# Patient Record
Sex: Female | Born: 1960 | Race: White | Hispanic: No | Smoking: Never smoker
Health system: Southern US, Community
[De-identification: ages and names within clinical notes are randomized; demographics above are authoritative.]

## PROBLEM LIST (undated history)

## (undated) DIAGNOSIS — S52133A Displaced fracture of neck of unspecified radius, initial encounter for closed fracture: Secondary | ICD-10-CM

## (undated) DIAGNOSIS — E559 Vitamin D deficiency, unspecified: Secondary | ICD-10-CM

## (undated) DIAGNOSIS — C801 Malignant (primary) neoplasm, unspecified: Secondary | ICD-10-CM

## (undated) DIAGNOSIS — S52023A Displaced fracture of olecranon process without intraarticular extension of unspecified ulna, initial encounter for closed fracture: Secondary | ICD-10-CM

## (undated) DIAGNOSIS — J189 Pneumonia, unspecified organism: Secondary | ICD-10-CM

## (undated) DIAGNOSIS — R112 Nausea with vomiting, unspecified: Secondary | ICD-10-CM

## (undated) DIAGNOSIS — Z9889 Other specified postprocedural states: Secondary | ICD-10-CM

## (undated) DIAGNOSIS — F419 Anxiety disorder, unspecified: Secondary | ICD-10-CM

## (undated) DIAGNOSIS — R519 Headache, unspecified: Secondary | ICD-10-CM

## (undated) DIAGNOSIS — R51 Headache: Secondary | ICD-10-CM

## (undated) DIAGNOSIS — T7840XA Allergy, unspecified, initial encounter: Secondary | ICD-10-CM

## (undated) HISTORY — DX: Allergy, unspecified, initial encounter: T78.40XA

## (undated) HISTORY — PX: ABDOMINAL HYSTERECTOMY: SHX81

## (undated) HISTORY — DX: Anxiety disorder, unspecified: F41.9

## (undated) HISTORY — PX: CHOLECYSTECTOMY: SHX55

## (undated) HISTORY — DX: Vitamin D deficiency, unspecified: E55.9

---

## 2001-04-23 ENCOUNTER — Emergency Department (HOSPITAL_COMMUNITY): Admission: EM | Admit: 2001-04-23 | Discharge: 2001-04-23 | Payer: Self-pay | Admitting: *Deleted

## 2001-04-23 ENCOUNTER — Encounter: Payer: Self-pay | Admitting: *Deleted

## 2002-08-11 ENCOUNTER — Ambulatory Visit (HOSPITAL_COMMUNITY): Admission: RE | Admit: 2002-08-11 | Discharge: 2002-08-11 | Payer: Self-pay | Admitting: Preventative Medicine

## 2002-08-11 ENCOUNTER — Encounter: Payer: Self-pay | Admitting: Preventative Medicine

## 2003-02-14 ENCOUNTER — Ambulatory Visit (HOSPITAL_COMMUNITY): Admission: RE | Admit: 2003-02-14 | Discharge: 2003-02-14 | Payer: Self-pay

## 2004-07-26 ENCOUNTER — Ambulatory Visit (HOSPITAL_COMMUNITY): Admission: RE | Admit: 2004-07-26 | Discharge: 2004-07-26 | Payer: Self-pay | Admitting: Obstetrics and Gynecology

## 2006-09-16 ENCOUNTER — Ambulatory Visit: Payer: Self-pay | Admitting: Hematology & Oncology

## 2007-12-01 ENCOUNTER — Other Ambulatory Visit: Admission: RE | Admit: 2007-12-01 | Discharge: 2007-12-01 | Payer: Self-pay | Admitting: Obstetrics and Gynecology

## 2008-08-26 ENCOUNTER — Emergency Department (HOSPITAL_COMMUNITY): Admission: EM | Admit: 2008-08-26 | Discharge: 2008-08-26 | Payer: Self-pay | Admitting: Emergency Medicine

## 2009-11-27 ENCOUNTER — Other Ambulatory Visit
Admission: RE | Admit: 2009-11-27 | Discharge: 2009-11-27 | Payer: Self-pay | Source: Home / Self Care | Admitting: Obstetrics and Gynecology

## 2010-11-29 ENCOUNTER — Other Ambulatory Visit (HOSPITAL_COMMUNITY)
Admission: RE | Admit: 2010-11-29 | Discharge: 2010-11-29 | Disposition: A | Discharge status: Home / Self Care | Payer: BC Managed Care – PPO | Source: Ambulatory Visit | Attending: Obstetrics and Gynecology | Admitting: Obstetrics and Gynecology

## 2010-11-29 DIAGNOSIS — Z01419 Encounter for gynecological examination (general) (routine) without abnormal findings: Secondary | ICD-10-CM | POA: Insufficient documentation

## 2011-12-23 ENCOUNTER — Other Ambulatory Visit: Payer: Self-pay | Admitting: Obstetrics and Gynecology

## 2011-12-23 DIAGNOSIS — Z09 Encounter for follow-up examination after completed treatment for conditions other than malignant neoplasm: Secondary | ICD-10-CM

## 2011-12-23 DIAGNOSIS — Z139 Encounter for screening, unspecified: Secondary | ICD-10-CM

## 2012-01-08 ENCOUNTER — Encounter (HOSPITAL_COMMUNITY): Payer: BC Managed Care – PPO

## 2012-01-15 ENCOUNTER — Encounter (HOSPITAL_COMMUNITY): Payer: BC Managed Care – PPO

## 2012-01-16 ENCOUNTER — Other Ambulatory Visit (HOSPITAL_COMMUNITY)
Admission: RE | Admit: 2012-01-16 | Discharge: 2012-01-16 | Disposition: A | Discharge status: Home / Self Care | Payer: BC Managed Care – PPO | Source: Ambulatory Visit | Attending: Obstetrics and Gynecology | Admitting: Obstetrics and Gynecology

## 2012-01-16 ENCOUNTER — Other Ambulatory Visit: Payer: Self-pay | Admitting: Adult Health

## 2012-01-16 ENCOUNTER — Ambulatory Visit (HOSPITAL_COMMUNITY)
Admission: RE | Admit: 2012-01-16 | Discharge: 2012-01-16 | Disposition: A | Discharge status: Home / Self Care | Payer: BC Managed Care – PPO | Source: Ambulatory Visit | Attending: Obstetrics and Gynecology | Admitting: Obstetrics and Gynecology

## 2012-01-16 DIAGNOSIS — Z1151 Encounter for screening for human papillomavirus (HPV): Secondary | ICD-10-CM | POA: Insufficient documentation

## 2012-01-16 DIAGNOSIS — Z09 Encounter for follow-up examination after completed treatment for conditions other than malignant neoplasm: Secondary | ICD-10-CM

## 2012-01-16 DIAGNOSIS — Z01419 Encounter for gynecological examination (general) (routine) without abnormal findings: Secondary | ICD-10-CM | POA: Insufficient documentation

## 2012-01-16 DIAGNOSIS — R928 Other abnormal and inconclusive findings on diagnostic imaging of breast: Secondary | ICD-10-CM | POA: Insufficient documentation

## 2012-08-15 ENCOUNTER — Other Ambulatory Visit: Payer: Self-pay | Admitting: Adult Health

## 2013-01-19 ENCOUNTER — Other Ambulatory Visit: Payer: Self-pay | Admitting: Adult Health

## 2014-02-24 ENCOUNTER — Other Ambulatory Visit: Payer: Self-pay | Admitting: Adult Health

## 2014-04-18 ENCOUNTER — Other Ambulatory Visit: Payer: Self-pay | Admitting: Adult Health

## 2014-04-28 ENCOUNTER — Ambulatory Visit (INDEPENDENT_AMBULATORY_CARE_PROVIDER_SITE_OTHER): Payer: BLUE CROSS/BLUE SHIELD | Admitting: Adult Health

## 2014-04-28 ENCOUNTER — Encounter: Payer: Self-pay | Admitting: Adult Health

## 2014-04-28 VITALS — BP 108/72 | HR 92 | Ht 67.0 in | Wt 192.0 lb

## 2014-04-28 DIAGNOSIS — Z1212 Encounter for screening for malignant neoplasm of rectum: Secondary | ICD-10-CM | POA: Diagnosis not present

## 2014-04-28 DIAGNOSIS — Z01419 Encounter for gynecological examination (general) (routine) without abnormal findings: Secondary | ICD-10-CM

## 2014-04-28 DIAGNOSIS — F419 Anxiety disorder, unspecified: Secondary | ICD-10-CM | POA: Insufficient documentation

## 2014-04-28 LAB — HEMOCCULT GUIAC POC 1CARD (OFFICE): Fecal Occult Blood, POC: NEGATIVE

## 2014-04-28 MED ORDER — ESCITALOPRAM OXALATE 20 MG PO TABS: 20.0000 mg | ORAL_TABLET | Freq: Every day | ORAL | Status: DC

## 2014-04-28 NOTE — Progress Notes (Signed)
Patient ID: Rhonda [name], female   DOB: 14-Mar-1960, 54 y.o.   MRN: [id number] History of Present Illness: Rhonda [name] is a 54 year old white female, divorced in for well woman gyn exam.She had a normal pap with negative HPV 01/16/12.She still sells insurance.   Current Medications, Allergies, Past Medical History, Past Surgical History, Family History and Social History were reviewed in [employer] record.     Review of Systems: Patient denies any headaches, hearing loss, fatigue, blurred vision, shortness of breath, chest pain, abdominal pain, problems with bowel movements(occasional constipation), urination, or intercourse(not having sex). No joint pain or mood swings.Has some allergies and is taking lexapro for anxiety and it works.    Physical Exam:BP 108/72 mmHg  Pulse 92  Ht 5\' 7"  (1.702 m)  Wt 192 lb (87.091 kg)  BMI 30.06 kg/m2 General:  Well developed, well nourished, no acute distress Skin:  Warm and dry Neck:  Midline trachea, normal thyroid, good ROM, no lymphadenopathy Lungs; Clear to auscultation bilaterally Breast:  No dominant palpable mass, retraction, or nipple discharge Cardiovascular: Regular rate and rhythm Abdomen:  Soft, non tender, no hepatosplenomegaly Pelvic:  External genitalia is normal in appearance, no lesions.  The vagina is normal in appearance. Urethra has no lesions or masses. The cervix and uterus are absent.  No adnexal masses or tenderness noted.Bladder is non tender, no masses felt. Rectal: Good sphincter tone, no polyps, or hemorrhoids felt.  Hemoccult negative. Extremities/musculoskeletal:  No swelling or varicosities noted, no clubbing or cyanosis Psych:  No mood changes, alert and cooperative,seems happy   Impression: Well woman gyn exam no pap Anxiety     Plan: Check CBC,CMP,TSH and lipids Physical in 1 year Mammogram now and yearly Colonoscopy advised Refilled lexapro 20 mg #30 1 daily with 11 refills

## 2014-04-28 NOTE — Patient Instructions (Signed)
Physical in 1 year Mammogram now and yearly Colonoscopy advised

## 2014-04-29 ENCOUNTER — Telehealth: Payer: Self-pay | Admitting: Adult Health

## 2014-04-29 LAB — COMPREHENSIVE METABOLIC PANEL
A/G RATIO: 1.8 (ref 1.1–2.5)
ALBUMIN: 4.8 g/dL (ref 3.5–5.5)
ALK PHOS: 111 IU/L (ref 39–117)
ALT: 17 IU/L (ref 0–32)
AST: 17 IU/L (ref 0–40)
BILIRUBIN TOTAL: 0.3 mg/dL (ref 0.0–1.2)
BUN / CREAT RATIO: 11 (ref 9–23)
BUN: 9 mg/dL (ref 6–24)
CO2: 23 mmol/L (ref 18–29)
Calcium: 9.5 mg/dL (ref 8.7–10.2)
Chloride: 99 mmol/L (ref 97–108)
Creatinine, Ser: 0.8 mg/dL (ref 0.57–1.00)
GFR, EST AFRICAN AMERICAN: 97 mL/min/{1.73_m2} (ref >59)
GFR, EST NON AFRICAN AMERICAN: 84 mL/min/{1.73_m2} (ref >59)
GLOBULIN, TOTAL: 2.6 g/dL (ref 1.5–4.5)
GLUCOSE: 94 mg/dL (ref 65–99)
Potassium: 4.3 mmol/L (ref 3.5–5.2)
Sodium: 140 mmol/L (ref 134–144)
TOTAL PROTEIN: 7.4 g/dL (ref 6.0–8.5)

## 2014-04-29 LAB — CBC
HEMATOCRIT: 39.6 % (ref 34.0–46.6)
HEMOGLOBIN: 13.9 g/dL (ref 11.1–15.9)
MCH: 29.3 pg (ref 26.6–33.0)
MCHC: 35.1 g/dL (ref 31.5–35.7)
MCV: 84 fL (ref 79–97)
Platelets: 355 10*3/uL (ref 150–379)
RBC: 4.74 x10E6/uL (ref 3.77–5.28)
RDW: 13.3 % (ref 12.3–15.4)
WBC: 7 10*3/uL (ref 3.4–10.8)

## 2014-04-29 LAB — LIPID PANEL
CHOL/HDL RATIO: 3.4 ratio (ref 0.0–4.4)
CHOLESTEROL TOTAL: 258 mg/dL — AB (ref 100–199)
HDL: 77 mg/dL (ref >39)
LDL Calculated: 160 mg/dL — ABNORMAL HIGH (ref 0–99)
Triglycerides: 103 mg/dL (ref 0–149)
VLDL Cholesterol Cal: 21 mg/dL (ref 5–40)

## 2014-04-29 LAB — TSH: TSH: 2.55 u[IU]/mL (ref 0.450–4.500)

## 2014-04-29 NOTE — Telephone Encounter (Signed)
Left message labs looked good

## 2014-11-12 ENCOUNTER — Emergency Department (HOSPITAL_COMMUNITY): Payer: BLUE CROSS/BLUE SHIELD

## 2014-11-12 ENCOUNTER — Encounter (HOSPITAL_COMMUNITY): Payer: Self-pay | Admitting: *Deleted

## 2014-11-12 ENCOUNTER — Emergency Department (HOSPITAL_COMMUNITY)
Admission: EM | Admit: 2014-11-12 | Discharge: 2014-11-13 | Disposition: A | Discharge status: Home / Self Care | Payer: BLUE CROSS/BLUE SHIELD | Attending: Emergency Medicine | Admitting: Emergency Medicine

## 2014-11-12 DIAGNOSIS — Y9389 Activity, other specified: Secondary | ICD-10-CM | POA: Diagnosis not present

## 2014-11-12 DIAGNOSIS — Z79899 Other long term (current) drug therapy: Secondary | ICD-10-CM | POA: Insufficient documentation

## 2014-11-12 DIAGNOSIS — S59902A Unspecified injury of left elbow, initial encounter: Secondary | ICD-10-CM | POA: Diagnosis present

## 2014-11-12 DIAGNOSIS — S52182A Other fracture of upper end of left radius, initial encounter for closed fracture: Secondary | ICD-10-CM | POA: Diagnosis not present

## 2014-11-12 DIAGNOSIS — S42402A Unspecified fracture of lower end of left humerus, initial encounter for closed fracture: Secondary | ICD-10-CM

## 2014-11-12 DIAGNOSIS — W010XXA Fall on same level from slipping, tripping and stumbling without subsequent striking against object, initial encounter: Secondary | ICD-10-CM | POA: Insufficient documentation

## 2014-11-12 DIAGNOSIS — Y998 Other external cause status: Secondary | ICD-10-CM | POA: Insufficient documentation

## 2014-11-12 DIAGNOSIS — Y9289 Other specified places as the place of occurrence of the external cause: Secondary | ICD-10-CM | POA: Insufficient documentation

## 2014-11-12 DIAGNOSIS — S52092A Other fracture of upper end of left ulna, initial encounter for closed fracture: Secondary | ICD-10-CM | POA: Insufficient documentation

## 2014-11-12 DIAGNOSIS — F419 Anxiety disorder, unspecified: Secondary | ICD-10-CM | POA: Diagnosis not present

## 2014-11-12 MED ORDER — FENTANYL CITRATE (PF) 100 MCG/2ML IJ SOLN
100.0000 ug | Freq: Once | INTRAMUSCULAR | Status: AC
  Administered 2014-11-12: 100 ug via INTRAVENOUS

## 2014-11-12 MED ORDER — FENTANYL CITRATE (PF) 100 MCG/2ML IJ SOLN
INTRAMUSCULAR | Status: AC
  Administered 2014-11-12: 100 ug via INTRAVENOUS
  Filled 2014-11-12: qty 2

## 2014-11-12 NOTE — ED Provider Notes (Signed)
CSN: [id number]     Arrival date & time 11/12/14  2122 History   First MD Initiated Contact with Patient 11/12/14 2150     Chief Complaint  Patient presents with  . Elbow Injury     (Consider location/radiation/quality/duration/timing/severity/associated sxs/prior Treatment) HPI   Rhonda [name] is a 54 y.o. female who presents to the Emergency Department complaining of sudden onset of left elbow pain after a mechanical fall that occurred at 8:00 pm this evening.  She states that she tripped over a toy and fell onto her left elbow.  Describes a severe pain with movement of the left elbow and "feels like bones crunching when I try to move it"  She denies other injuries.  She has applied ice packs, she has not taken any medications for symptom relief.  She denies numbness, neck pain, head injury , LOC and wrist pain.  She also denies anti-coagulants.   Past Medical History  Diagnosis Date  . Anxiety   . Allergy     seasonal   Past Surgical History  Procedure Laterality Date  . Cholecystectomy     Family History  Problem Relation Age of Onset  . Other Mother     sepsis  . Heart attack Father   . Hypertension Sister   . Hypertension Brother   . Epilepsy Daughter   . ADD / ADHD Daughter   . Hypertension Brother    Social History  Substance Use Topics  . Smoking status: Never Smoker   . Smokeless tobacco: Never Used  . Alcohol Use: Yes     Comment: wine occ   OB History    Gravida Para Term Preterm AB TAB SAB Ectopic Multiple Living   1 1             Review of Systems  Constitutional: Negative for fever and chills.  Respiratory: Negative for shortness of breath.   Cardiovascular: Negative for chest pain.  Gastrointestinal: Negative for nausea and vomiting.  Musculoskeletal: Positive for joint swelling and arthralgias. Negative for neck pain.  Skin: Negative for color change and wound.  Neurological: Negative for syncope, weakness and numbness.  All other systems  reviewed and are negative.     Allergies  Review of patient's allergies indicates no known allergies.  Home Medications   Prior to Admission medications   Medication Sig Start Date End Date Taking? Authorizing Provider  escitalopram (LEXAPRO) 20 MG tablet Take 1 tablet (20 mg total) by mouth daily. 04/28/14   Estill Dooms, NP  Loratadine-Pseudoephedrine (CLARITIN-D 24 HOUR PO) Take by mouth as needed.    Historical Provider, MD  Multiple Vitamin (MULTIVITAMIN) tablet Take 1 tablet by mouth daily.    Historical Provider, MD  Multiple Vitamins-Minerals (AIRBORNE PO) Take by mouth as needed.    Historical Provider, MD   BP 127/56 mmHg  Pulse 116  Temp(Src) 98 F (36.7 C) (Oral)  Resp 14  Ht 5\' 8"  (1.727 m)  Wt 188 lb (85.276 kg)  BMI 28.59 kg/m2  SpO2 99% Physical Exam  Constitutional: She is oriented to person, place, and time. She appears well-developed and well-nourished. No distress.  HENT:  Head: Normocephalic and atraumatic.  Cardiovascular: Normal rate, regular rhythm and normal heart sounds.   Pulmonary/Chest: Effort normal and breath sounds normal. No respiratory distress.  Musculoskeletal: She exhibits edema and tenderness.  Diffuse ttp of the left lateral elbow.  Mild edema.  Radial pulse is brisk, distal sensation intact.  CR< 2 sec.  Compartments  soft.  No significant tenderness proximal or distal to the elbow.  Neurological: She is alert and oriented to person, place, and time. She exhibits normal muscle tone. Coordination normal.  Skin: Skin is warm and dry.  Nursing note and vitals reviewed.   ED Course  Procedures (including critical care time)  Imaging Review Dg Elbow 2 Views Left  11/13/2014  CLINICAL DATA:  Status post reduction of left elbow dislocation EXAM: LEFT ELBOW - 2 VIEW COMPARISON:  None. FINDINGS: There is a comminuted, mildly displaced proximal left ulnar fracture. There is interval reduction of ulnohumeral dislocation. There is a  fracture of the left radial head which is dorsally dislocated. There is a small fracture fragment which is displaced anteriorly arising from the left radial head. There is a large joint effusion. IMPRESSION: 1. Comminuted, mildly displaced proximal left ulnar fracture. There is interval reduction of ulnohumeral dislocation. 2. Fracture of the left radial head which is dorsally dislocated. Electronically Signed   By: Kathreen Devoid   On: 11/13/2014 00:53   Dg Elbow Complete Left  11/12/2014  CLINICAL DATA:  Status post fall.  Ulnar pain. EXAM: LEFT ELBOW - COMPLETE 3+ VIEW COMPARISON:  None. FINDINGS: There is a comminuted fracture of the proximal left ulna with posterior dislocation. There is a nondisplaced fracture of the volar aspect of the left radial head with posterior dislocation. There is no other fracture. IMPRESSION: Comminuted fracture of the proximal left ulna with posterior dislocation. Nondisplaced fracture of the volar aspect of the left radial head with posterior dislocation. Electronically Signed   By: Kathreen Devoid   On: 11/12/2014 22:34   I have personally reviewed and evaluated these images results as part of my medical decision-making.    MDM   Final diagnoses:  Closed fracture dislocation of left elbow, initial encounter   Pt remains NV intact.  No open wounds.  XR results reviewed.  Ice applied.  Pt remains NPO, last meal at 7:00 pm.  Will consult hand surgeon on call.    2250  Consulted Dr. Burney Gauze , discussed the XR findings, he recommends reduction of the elbow and post splint, he agrees to see pt in his office on Monday afternoon, stating that reduction is indicated and surgical repair can be performed on outpatient basis.   I have discussed care plan with my attending physician and I was advised to contact general orthopedics.    Jordan Lorin Mercy who has reviewed the films and discussed specific treatment plan with Dr. Thurnell Garbe.    Kem Parkinson, PA-C 11/13/14  Goff, DO 11/16/14 2114

## 2014-11-12 NOTE — ED Notes (Signed)
Pt tripped over a toy and fell on her left elbow.

## 2014-11-12 NOTE — ED Notes (Signed)
With assistance of Dr. Thurnell Garbe, Cyndi Bender, PA, and [facility] NT, pt arm was placed in correct position for splinting and splinted using posterior long arm splint and then placed in sling. Pt was premedicated with 145mcg of fentanyl IVP per EDP verbal order. Pt Tolerated Well. Follow up xray ordered.

## 2014-11-13 MED ORDER — OXYCODONE-ACETAMINOPHEN 5-325 MG PO TABS: 1.0000 | ORAL_TABLET | ORAL | Status: DC | PRN

## 2014-11-13 NOTE — ED Notes (Signed)
Pt alert & oriented x4, stable gait. Patient given discharge instructions, paperwork & prescription(s).Patient informed not to drive, operate any equipment & handel any important documents 4 hours after taking pain medication.  Patient verbalized understanding. Pt left department in wheelchair w/ no further questions. 

## 2014-11-16 ENCOUNTER — Other Ambulatory Visit (HOSPITAL_COMMUNITY): Payer: Self-pay | Admitting: Orthopaedic Surgery

## 2014-11-17 ENCOUNTER — Encounter (HOSPITAL_COMMUNITY): Payer: Self-pay | Admitting: *Deleted

## 2014-11-17 NOTE — Progress Notes (Signed)
Pt denies SOB, chest pain, and being under the care of a cardiologist. Pt denies having a stress test, echo and cardiac cath. Pt denies having a chest x ray and EKG within the last year. Spoke with Ebony Hail, [place], anesthesia, to make aware that pt took her last dose of Claritin-D yesterday; pt advised to stop taking Claritin now. Pt made aware to sop taking Aspirin, otc vitamins and herbal medications. Do not take any NSAIDs ie: Ibuprofen, Advil, Naproxen or any medication containing Aspirin. Pt verbalized understanding of all pre-op instructions.

## 2014-11-18 ENCOUNTER — Ambulatory Visit (HOSPITAL_COMMUNITY): Payer: BLUE CROSS/BLUE SHIELD | Admitting: Certified Registered Nurse Anesthetist

## 2014-11-18 ENCOUNTER — Observation Stay (HOSPITAL_COMMUNITY)
Admission: RE | Admit: 2014-11-18 | Discharge: 2014-11-19 | Disposition: A | Discharge status: Home / Self Care | Payer: BLUE CROSS/BLUE SHIELD | Source: Ambulatory Visit | Attending: Orthopaedic Surgery | Admitting: Orthopaedic Surgery

## 2014-11-18 ENCOUNTER — Ambulatory Visit (HOSPITAL_COMMUNITY): Payer: BLUE CROSS/BLUE SHIELD

## 2014-11-18 ENCOUNTER — Encounter (HOSPITAL_COMMUNITY): Payer: Self-pay | Admitting: *Deleted

## 2014-11-18 ENCOUNTER — Encounter (HOSPITAL_COMMUNITY)
Admission: RE | Disposition: A | Discharge status: Home / Self Care | Payer: Self-pay | Source: Ambulatory Visit | Attending: Orthopaedic Surgery

## 2014-11-18 DIAGNOSIS — S52032A Displaced fracture of olecranon process with intraarticular extension of left ulna, initial encounter for closed fracture: Secondary | ICD-10-CM | POA: Diagnosis not present

## 2014-11-18 DIAGNOSIS — Z79899 Other long term (current) drug therapy: Secondary | ICD-10-CM | POA: Insufficient documentation

## 2014-11-18 DIAGNOSIS — S52122A Displaced fracture of head of left radius, initial encounter for closed fracture: Secondary | ICD-10-CM | POA: Diagnosis not present

## 2014-11-18 DIAGNOSIS — Z8541 Personal history of malignant neoplasm of cervix uteri: Secondary | ICD-10-CM | POA: Insufficient documentation

## 2014-11-18 DIAGNOSIS — Z419 Encounter for procedure for purposes other than remedying health state, unspecified: Secondary | ICD-10-CM

## 2014-11-18 DIAGNOSIS — S52022A Displaced fracture of olecranon process without intraarticular extension of left ulna, initial encounter for closed fracture: Secondary | ICD-10-CM

## 2014-11-18 DIAGNOSIS — S42402A Unspecified fracture of lower end of left humerus, initial encounter for closed fracture: Secondary | ICD-10-CM | POA: Diagnosis present

## 2014-11-18 DIAGNOSIS — W010XXA Fall on same level from slipping, tripping and stumbling without subsequent striking against object, initial encounter: Secondary | ICD-10-CM | POA: Diagnosis not present

## 2014-11-18 DIAGNOSIS — F419 Anxiety disorder, unspecified: Secondary | ICD-10-CM | POA: Insufficient documentation

## 2014-11-18 DIAGNOSIS — S52023A Displaced fracture of olecranon process without intraarticular extension of unspecified ulna, initial encounter for closed fracture: Secondary | ICD-10-CM | POA: Diagnosis present

## 2014-11-18 HISTORY — DX: Malignant (primary) neoplasm, unspecified: C80.1

## 2014-11-18 HISTORY — DX: Headache: R51

## 2014-11-18 HISTORY — PX: ORIF ELBOW FRACTURE: SHX5031

## 2014-11-18 HISTORY — DX: Other specified postprocedural states: Z98.890

## 2014-11-18 HISTORY — DX: Displaced fracture of neck of unspecified radius, initial encounter for closed fracture: S52.133A

## 2014-11-18 HISTORY — DX: Nausea with vomiting, unspecified: R11.2

## 2014-11-18 HISTORY — DX: Headache, unspecified: R51.9

## 2014-11-18 HISTORY — DX: Pneumonia, unspecified organism: J18.9

## 2014-11-18 HISTORY — DX: Displaced fracture of olecranon process without intraarticular extension of unspecified ulna, initial encounter for closed fracture: S52.023A

## 2014-11-18 LAB — COMPREHENSIVE METABOLIC PANEL
ALBUMIN: 3.7 g/dL (ref 3.5–5.0)
ALT: 27 U/L (ref 14–54)
ANION GAP: 9 (ref 5–15)
AST: 21 U/L (ref 15–41)
Alkaline Phosphatase: 99 U/L (ref 38–126)
BUN: 10 mg/dL (ref 6–20)
CHLORIDE: 107 mmol/L (ref 101–111)
CO2: 24 mmol/L (ref 22–32)
Calcium: 8.9 mg/dL (ref 8.9–10.3)
Creatinine, Ser: 0.78 mg/dL (ref 0.44–1.00)
GFR calc non Af Amer: >60 mL/min (ref >60)
GLUCOSE: 96 mg/dL (ref 65–99)
POTASSIUM: 3.6 mmol/L (ref 3.5–5.1)
SODIUM: 140 mmol/L (ref 135–145)
Total Bilirubin: 0.6 mg/dL (ref 0.3–1.2)
Total Protein: 6.7 g/dL (ref 6.5–8.1)

## 2014-11-18 LAB — URINALYSIS, ROUTINE W REFLEX MICROSCOPIC
Bilirubin Urine: NEGATIVE
GLUCOSE, UA: NEGATIVE mg/dL
Hgb urine dipstick: NEGATIVE
KETONES UR: NEGATIVE mg/dL
Nitrite: NEGATIVE
PROTEIN: NEGATIVE mg/dL
Specific Gravity, Urine: 1.019 (ref 1.005–1.030)
UROBILINOGEN UA: 1 mg/dL (ref 0.0–1.0)
pH: 7 (ref 5.0–8.0)

## 2014-11-18 LAB — CBC
HCT: 37.6 % (ref 36.0–46.0)
HEMOGLOBIN: 12.3 g/dL (ref 12.0–15.0)
MCH: 28.1 pg (ref 26.0–34.0)
MCHC: 32.7 g/dL (ref 30.0–36.0)
MCV: 86 fL (ref 78.0–100.0)
PLATELETS: 308 10*3/uL (ref 150–400)
RBC: 4.37 MIL/uL (ref 3.87–5.11)
RDW: 13.2 % (ref 11.5–15.5)
WBC: 5.2 10*3/uL (ref 4.0–10.5)

## 2014-11-18 LAB — URINE MICROSCOPIC-ADD ON

## 2014-11-18 LAB — PROTIME-INR
INR: 0.98 (ref 0.00–1.49)
Prothrombin Time: 13.2 seconds (ref 11.6–15.2)

## 2014-11-18 SURGERY — OPEN REDUCTION INTERNAL FIXATION (ORIF) ELBOW/OLECRANON FRACTURE
Anesthesia: General | Site: Elbow | Laterality: Left

## 2014-11-18 MED ORDER — FENTANYL CITRATE (PF) 100 MCG/2ML IJ SOLN
100.0000 ug | Freq: Once | INTRAMUSCULAR | Status: AC
  Administered 2014-11-18: 100 ug via INTRAVENOUS

## 2014-11-18 MED ORDER — SUCCINYLCHOLINE CHLORIDE 20 MG/ML IJ SOLN
INTRAMUSCULAR | Status: AC
  Filled 2014-11-18: qty 1

## 2014-11-18 MED ORDER — OXYCODONE-ACETAMINOPHEN 5-325 MG PO TABS: 1.0000 | ORAL_TABLET | ORAL | Status: DC | PRN

## 2014-11-18 MED ORDER — FENTANYL CITRATE (PF) 100 MCG/2ML IJ SOLN
INTRAMUSCULAR | Status: DC | PRN
  Administered 2014-11-18: 50 ug via INTRAVENOUS
  Administered 2014-11-18 (×2): 25 ug via INTRAVENOUS

## 2014-11-18 MED ORDER — FENTANYL CITRATE (PF) 100 MCG/2ML IJ SOLN: 100.0000 ug | Freq: Once | INTRAMUSCULAR | Status: DC

## 2014-11-18 MED ORDER — POTASSIUM CHLORIDE IN NACL 20-0.45 MEQ/L-% IV SOLN
INTRAVENOUS | Status: DC
  Administered 2014-11-18: 75 mL/h via INTRAVENOUS
  Filled 2014-11-18 (×4): qty 1000

## 2014-11-18 MED ORDER — MIDAZOLAM HCL 2 MG/2ML IJ SOLN
INTRAMUSCULAR | Status: AC
  Administered 2014-11-18: 2 mg via INTRAVENOUS
  Filled 2014-11-18: qty 2

## 2014-11-18 MED ORDER — PROPOFOL 500 MG/50ML IV EMUL
INTRAVENOUS | Status: DC | PRN
  Administered 2014-11-18: 150 ug/kg/min via INTRAVENOUS

## 2014-11-18 MED ORDER — LACTATED RINGERS IV SOLN
INTRAVENOUS | Status: DC
  Administered 2014-11-18 (×2): via INTRAVENOUS

## 2014-11-18 MED ORDER — BUPIVACAINE-EPINEPHRINE (PF) 0.5% -1:200000 IJ SOLN
INTRAMUSCULAR | Status: DC | PRN
  Administered 2014-11-18: 30 mL via PERINEURAL

## 2014-11-18 MED ORDER — ACETAMINOPHEN 325 MG PO TABS
650.0000 mg | ORAL_TABLET | Freq: Four times a day (QID) | ORAL | Status: DC | PRN
  Administered 2014-11-18: 650 mg via ORAL
  Filled 2014-11-18: qty 2

## 2014-11-18 MED ORDER — METOCLOPRAMIDE HCL 5 MG/ML IJ SOLN
5.0000 mg | Freq: Three times a day (TID) | INTRAMUSCULAR | Status: DC | PRN
  Administered 2014-11-19: 5 mg via INTRAVENOUS
  Filled 2014-11-18: qty 2

## 2014-11-18 MED ORDER — ONDANSETRON HCL 4 MG/2ML IJ SOLN: 4.0000 mg | Freq: Once | INTRAMUSCULAR | Status: DC | PRN

## 2014-11-18 MED ORDER — CEFAZOLIN SODIUM-DEXTROSE 2-3 GM-% IV SOLR
2.0000 g | INTRAVENOUS | Status: AC
  Administered 2014-11-18: 2 g via INTRAVENOUS
  Filled 2014-11-18: qty 50

## 2014-11-18 MED ORDER — ESCITALOPRAM OXALATE 10 MG PO TABS
20.0000 mg | ORAL_TABLET | Freq: Every day | ORAL | Status: DC
  Administered 2014-11-19: 20 mg via ORAL
  Filled 2014-11-18: qty 2

## 2014-11-18 MED ORDER — METHOCARBAMOL 500 MG PO TABS: 500.0000 mg | ORAL_TABLET | Freq: Four times a day (QID) | ORAL | Status: DC | PRN

## 2014-11-18 MED ORDER — CEFAZOLIN SODIUM 1-5 GM-% IV SOLN
1.0000 g | Freq: Four times a day (QID) | INTRAVENOUS | Status: AC
  Administered 2014-11-18 (×2): 1 g via INTRAVENOUS
  Filled 2014-11-18 (×2): qty 50

## 2014-11-18 MED ORDER — 0.9 % SODIUM CHLORIDE (POUR BTL) OPTIME
TOPICAL | Status: DC | PRN
  Administered 2014-11-18: 1000 mL

## 2014-11-18 MED ORDER — METOCLOPRAMIDE HCL 5 MG PO TABS
5.0000 mg | ORAL_TABLET | Freq: Three times a day (TID) | ORAL | Status: DC | PRN
  Administered 2014-11-19: 5 mg via ORAL
  Filled 2014-11-18: qty 1

## 2014-11-18 MED ORDER — HYDROMORPHONE HCL 1 MG/ML IJ SOLN
1.0000 mg | INTRAMUSCULAR | Status: DC | PRN
  Administered 2014-11-19: 1 mg via INTRAVENOUS
  Filled 2014-11-18: qty 1

## 2014-11-18 MED ORDER — LIDOCAINE HCL (CARDIAC) 20 MG/ML IV SOLN
INTRAVENOUS | Status: AC
  Filled 2014-11-18: qty 5

## 2014-11-18 MED ORDER — FENTANYL CITRATE (PF) 250 MCG/5ML IJ SOLN
INTRAMUSCULAR | Status: AC
  Filled 2014-11-18: qty 5

## 2014-11-18 MED ORDER — ACETAMINOPHEN 650 MG RE SUPP: 650.0000 mg | Freq: Four times a day (QID) | RECTAL | Status: DC | PRN

## 2014-11-18 MED ORDER — ROCURONIUM BROMIDE 50 MG/5ML IV SOLN
INTRAVENOUS | Status: AC
  Filled 2014-11-18: qty 1

## 2014-11-18 MED ORDER — PHENYLEPHRINE 40 MCG/ML (10ML) SYRINGE FOR IV PUSH (FOR BLOOD PRESSURE SUPPORT)
PREFILLED_SYRINGE | INTRAVENOUS | Status: AC
  Filled 2014-11-18: qty 10

## 2014-11-18 MED ORDER — CHLORHEXIDINE GLUCONATE 4 % EX LIQD: 60.0000 mL | Freq: Once | CUTANEOUS | Status: DC

## 2014-11-18 MED ORDER — PROPOFOL 10 MG/ML IV BOLUS
INTRAVENOUS | Status: DC | PRN
  Administered 2014-11-18 (×3): 20 mg via INTRAVENOUS

## 2014-11-18 MED ORDER — ONDANSETRON HCL 4 MG/2ML IJ SOLN
INTRAMUSCULAR | Status: AC
  Filled 2014-11-18: qty 2

## 2014-11-18 MED ORDER — EPHEDRINE SULFATE 50 MG/ML IJ SOLN
INTRAMUSCULAR | Status: AC
Start: 2014-11-18 — End: 2014-11-18
  Filled 2014-11-18: qty 1

## 2014-11-18 MED ORDER — METHOCARBAMOL 1000 MG/10ML IJ SOLN
500.0000 mg | Freq: Four times a day (QID) | INTRAVENOUS | Status: DC | PRN
  Filled 2014-11-18: qty 5

## 2014-11-18 MED ORDER — ONDANSETRON HCL 4 MG/2ML IJ SOLN
INTRAMUSCULAR | Status: DC | PRN
  Administered 2014-11-18: 4 mg via INTRAVENOUS

## 2014-11-18 MED ORDER — PROPOFOL 10 MG/ML IV BOLUS
INTRAVENOUS | Status: AC
  Filled 2014-11-18: qty 20

## 2014-11-18 MED ORDER — MIDAZOLAM HCL 2 MG/2ML IJ SOLN
2.0000 mg | Freq: Once | INTRAMUSCULAR | Status: AC
  Administered 2014-11-18: 2 mg via INTRAVENOUS

## 2014-11-18 MED ORDER — PHENYLEPHRINE HCL 10 MG/ML IJ SOLN
INTRAMUSCULAR | Status: DC | PRN
  Administered 2014-11-18 (×2): 40 ug via INTRAVENOUS

## 2014-11-18 MED ORDER — OXYCODONE HCL 5 MG PO TABS
5.0000 mg | ORAL_TABLET | ORAL | Status: DC | PRN
  Administered 2014-11-18 – 2014-11-19 (×5): 10 mg via ORAL
  Filled 2014-11-18: qty 2
  Filled 2014-11-18: qty 1
  Filled 2014-11-18 (×5): qty 2

## 2014-11-18 MED ORDER — MEPERIDINE HCL 25 MG/ML IJ SOLN: 6.2500 mg | INTRAMUSCULAR | Status: DC | PRN

## 2014-11-18 MED ORDER — DOCUSATE SODIUM 100 MG PO CAPS
100.0000 mg | ORAL_CAPSULE | Freq: Two times a day (BID) | ORAL | Status: DC
  Administered 2014-11-18 – 2014-11-19 (×2): 100 mg via ORAL
  Filled 2014-11-18 (×2): qty 1

## 2014-11-18 MED ORDER — HYDROMORPHONE HCL 1 MG/ML IJ SOLN: 0.2500 mg | INTRAMUSCULAR | Status: DC | PRN

## 2014-11-18 MED ORDER — POLYETHYLENE GLYCOL 3350 17 G PO PACK: 17.0000 g | PACK | Freq: Every day | ORAL | Status: DC | PRN

## 2014-11-18 MED ORDER — MIDAZOLAM HCL 2 MG/2ML IJ SOLN
INTRAMUSCULAR | Status: AC
  Filled 2014-11-18: qty 4

## 2014-11-18 MED ORDER — FENTANYL CITRATE (PF) 100 MCG/2ML IJ SOLN
INTRAMUSCULAR | Status: AC
  Administered 2014-11-18: 100 ug via INTRAVENOUS
  Filled 2014-11-18: qty 2

## 2014-11-18 MED ORDER — LIDOCAINE HCL (CARDIAC) 20 MG/ML IV SOLN
INTRAVENOUS | Status: DC | PRN
  Administered 2014-11-18: 60 mg via INTRAVENOUS

## 2014-11-18 MED ORDER — ONDANSETRON HCL 4 MG/2ML IJ SOLN
4.0000 mg | Freq: Four times a day (QID) | INTRAMUSCULAR | Status: DC | PRN
  Administered 2014-11-19: 4 mg via INTRAVENOUS
  Filled 2014-11-18: qty 2

## 2014-11-18 MED ORDER — METHOCARBAMOL 500 MG PO TABS
500.0000 mg | ORAL_TABLET | Freq: Four times a day (QID) | ORAL | Status: DC | PRN
  Administered 2014-11-19: 500 mg via ORAL
  Filled 2014-11-18: qty 1

## 2014-11-18 MED ORDER — ONDANSETRON HCL 4 MG PO TABS
4.0000 mg | ORAL_TABLET | Freq: Four times a day (QID) | ORAL | Status: DC | PRN
  Administered 2014-11-19: 4 mg via ORAL
  Filled 2014-11-18: qty 1

## 2014-11-18 SURGICAL SUPPLY — 85 items
APL SKNCLS STERI-STRIP NONHPOA (GAUZE/BANDAGES/DRESSINGS)
BANDAGE ELASTIC 3 VELCRO ST LF (GAUZE/BANDAGES/DRESSINGS) ×3 IMPLANT
BANDAGE ELASTIC 4 VELCRO ST LF (GAUZE/BANDAGES/DRESSINGS) ×3 IMPLANT
BANDAGE ELASTIC 6 VELCRO ST LF (GAUZE/BANDAGES/DRESSINGS) ×3 IMPLANT
BENZOIN TINCTURE PRP APPL 2/3 (GAUZE/BANDAGES/DRESSINGS) IMPLANT
BIT DRILL 2.5X2.75 QC CALB (BIT) ×2 IMPLANT
BLADE AVERAGE 25MMX9MM (BLADE) ×1
BLADE AVERAGE 25X9 (BLADE) ×2 IMPLANT
BLADE SURG 10 STRL SS (BLADE) IMPLANT
BLADE SURG ROTATE 9660 (MISCELLANEOUS) ×3 IMPLANT
BNDG CMPR 9X4 STRL LF SNTH (GAUZE/BANDAGES/DRESSINGS) ×1
BNDG COHESIVE 3X5 TAN STRL LF (GAUZE/BANDAGES/DRESSINGS) ×3 IMPLANT
BNDG COHESIVE 4X5 TAN STRL (GAUZE/BANDAGES/DRESSINGS) ×3 IMPLANT
BNDG ESMARK 4X9 LF (GAUZE/BANDAGES/DRESSINGS) ×3 IMPLANT
BNDG GAUZE ELAST 4 BULKY (GAUZE/BANDAGES/DRESSINGS) ×3 IMPLANT
CLEANER TIP ELECTROSURG 2X2 (MISCELLANEOUS) ×3 IMPLANT
CLOSURE WOUND 1/2 X4 (GAUZE/BANDAGES/DRESSINGS)
COVER SURGICAL LIGHT HANDLE (MISCELLANEOUS) ×3 IMPLANT
CUFF TOURNIQUET SINGLE 18IN (TOURNIQUET CUFF) IMPLANT
CUFF TOURNIQUET SINGLE 24IN (TOURNIQUET CUFF) IMPLANT
DECANTER SPIKE VIAL GLASS SM (MISCELLANEOUS) IMPLANT
DRAPE C-ARM 42X72 X-RAY (DRAPES) IMPLANT
DRAPE INCISE IOBAN 66X45 STRL (DRAPES) IMPLANT
DRAPE U-SHAPE 47X51 STRL (DRAPES) ×3 IMPLANT
ELECT REM PT RETURN 9FT ADLT (ELECTROSURGICAL) ×3
ELECTRODE REM PT RTRN 9FT ADLT (ELECTROSURGICAL) ×1 IMPLANT
FACESHIELD WRAPAROUND (MASK) IMPLANT
FACESHIELD WRAPAROUND OR TEAM (MASK) IMPLANT
GAUZE SPONGE 4X4 12PLY STRL (GAUZE/BANDAGES/DRESSINGS) ×3 IMPLANT
GAUZE XEROFORM 1X8 LF (GAUZE/BANDAGES/DRESSINGS) ×3 IMPLANT
GAUZE XEROFORM 5X9 LF (GAUZE/BANDAGES/DRESSINGS) ×3 IMPLANT
GLOVE BIOGEL PI IND STRL 8 (GLOVE) ×1 IMPLANT
GLOVE BIOGEL PI INDICATOR 8 (GLOVE) ×2
GLOVE ORTHO TXT STRL SZ7.5 (GLOVE) ×66 IMPLANT
GOWN STRL REUS W/ TWL LRG LVL3 (GOWN DISPOSABLE) ×1 IMPLANT
GOWN STRL REUS W/ TWL XL LVL3 (GOWN DISPOSABLE) ×1 IMPLANT
GOWN STRL REUS W/TWL 2XL LVL3 (GOWN DISPOSABLE) ×3 IMPLANT
GOWN STRL REUS W/TWL LRG LVL3 (GOWN DISPOSABLE) ×3
GOWN STRL REUS W/TWL XL LVL3 (GOWN DISPOSABLE) ×3
IMPL HEAD (Orthopedic Implant) IMPLANT
IMPL STEM WITH SCREW 8X29 (Stem) IMPLANT
IMPLANT HEAD (Orthopedic Implant) ×3 IMPLANT
IMPLANT STEM WITH SCREW 8X29 (Stem) ×3 IMPLANT
K-WIRE FIXATION 2.0X6 (WIRE) ×9
KIT BASIN OR (CUSTOM PROCEDURE TRAY) ×3 IMPLANT
KIT ROOM TURNOVER OR (KITS) ×3 IMPLANT
KWIRE FIXATION 2.0X6 (WIRE) IMPLANT
MANIFOLD NEPTUNE II (INSTRUMENTS) ×3 IMPLANT
NS IRRIG 1000ML POUR BTL (IV SOLUTION) ×3 IMPLANT
PACK ORTHO EXTREMITY (CUSTOM PROCEDURE TRAY) ×3 IMPLANT
PAD ARMBOARD 7.5X6 YLW CONV (MISCELLANEOUS) ×6 IMPLANT
PAD CAST 4YDX4 CTTN HI CHSV (CAST SUPPLIES) ×1 IMPLANT
PADDING CAST COTTON 4X4 STRL (CAST SUPPLIES) ×3
PLATE OLECRANON SM (Plate) ×2 IMPLANT
SCREW CORTICAL LOW PROF 3.5X20 (Screw) ×4 IMPLANT
SCREW LOCK CORT STAR 3.5X10 (Screw) ×2 IMPLANT
SCREW LOCK CORT STAR 3.5X12 (Screw) ×2 IMPLANT
SCREW LOCKING EXPLOR (Screw) ×2 IMPLANT
SCREW LOW PROFILE 18MMX3.5MM (Screw) ×2 IMPLANT
SCREW LOW PROFILE 22MMX3.5MM (Screw) ×4 IMPLANT
SCREW LP 3.5X44 (Screw) ×2 IMPLANT
SCREW NON LOCKING LP 3.5 14MM (Screw) ×2 IMPLANT
SLING ARM FOAM STRAP LRG (SOFTGOODS) ×2 IMPLANT
SPLINT PLASTER CAST XFAST 4X15 (CAST SUPPLIES) IMPLANT
SPLINT PLASTER XTRA FAST SET 4 (CAST SUPPLIES) ×2
SPONGE GAUZE 4X4 12PLY STER LF (GAUZE/BANDAGES/DRESSINGS) ×2 IMPLANT
SPONGE LAP 18X18 X RAY DECT (DISPOSABLE) ×6 IMPLANT
SPONGE SCRUB IODOPHOR (GAUZE/BANDAGES/DRESSINGS) ×3 IMPLANT
STAPLER VISISTAT 35W (STAPLE) ×3 IMPLANT
STOCKINETTE IMPERVIOUS 9X36 MD (GAUZE/BANDAGES/DRESSINGS) ×3 IMPLANT
STRIP CLOSURE SKIN 1/2X4 (GAUZE/BANDAGES/DRESSINGS) IMPLANT
SUCTION FRAZIER TIP 10 FR DISP (SUCTIONS) ×3 IMPLANT
SUT PROLENE 3 0 PS 2 (SUTURE) ×6 IMPLANT
SUT VIC AB 0 CT1 27 (SUTURE) ×6
SUT VIC AB 0 CT1 27XBRD ANBCTR (SUTURE) ×2 IMPLANT
SUT VIC AB 2-0 CT1 27 (SUTURE) ×6
SUT VIC AB 2-0 CT1 TAPERPNT 27 (SUTURE) ×2 IMPLANT
SYR CONTROL 10ML LL (SYRINGE) ×3 IMPLANT
TOWEL OR 17X24 6PK STRL BLUE (TOWEL DISPOSABLE) IMPLANT
TOWEL OR 17X26 10 PK STRL BLUE (TOWEL DISPOSABLE) ×3 IMPLANT
TUBE CONNECTING 12'X1/4 (SUCTIONS) ×1
TUBE CONNECTING 12X1/4 (SUCTIONS) ×2 IMPLANT
UNDERPAD 30X30 INCONTINENT (UNDERPADS AND DIAPERS) ×3 IMPLANT
WATER STERILE IRR 1000ML POUR (IV SOLUTION) ×3 IMPLANT
YANKAUER SUCT BULB TIP NO VENT (SUCTIONS) ×3 IMPLANT

## 2014-11-18 NOTE — Anesthesia Postprocedure Evaluation (Signed)
Anesthesia Post Note  Patient: Rhonda [name]  Procedure(s) Performed: Procedure(s) (LRB): OPEN REDUCTION INTERNAL FIXATION (ORIF) LEFT OLECRANON, OPEN REDUCTION INTERNAL FIXATION LEFT RADIAL NECK FRACTURE (Left)  Anesthesia type: regional  Patient location: PACU  Post pain: Pain level controlled  Post assessment: Patient's Cardiovascular Status Stable  Last Vitals:  Filed Vitals:   11/18/14 1442  BP: 118/63  Pulse: 87  Temp: 36.6 C  Resp: 18    Post vital signs: Reviewed and stable  Level of consciousness: awake  Complications: No apparent anesthesia complications

## 2014-11-18 NOTE — Interval H&P Note (Signed)
History and Physical Interval Note:  11/18/2014 9:34 AM  Rhonda [name]  has presented today for surgery, with the diagnosis of Closed Left Olecranon, Radial Neck Fracture  The various methods of treatment have been discussed with the patient and family. After consideration of risks, benefits and other options for treatment, the patient has consented to  Procedure(s): OPEN REDUCTION INTERNAL FIXATION (ORIF) LEFT OLECRANON, OPEN REDUCTION INTERNAL FIXATION LEFT RADIAL NECK FRACTURE (Left) as a surgical intervention .  The patient's history has been reviewed, patient examined, no change in status, stable for surgery.  I have reviewed the patient's chart and labs.  Questions were answered to the patient's satisfaction.     Kodee Ravert C

## 2014-11-18 NOTE — H&P (Signed)
Rhonda [name] is an 54 y.o. female.   Chief Complaint:  Left elbow pain HPI: patient presented to the ED 12 Nov 2014 after she tripped and fell landing on her left elbow.  Marked pain after.  Limited ROM.  xrays showed left olecranon and radial neck fractures.  Was advised to f/u in our office for treatment recommendations.   Past Medical History  Diagnosis Date  . Anxiety   . Allergy     seasonal  . PONV (postoperative nausea and vomiting)   . Closed olecranon fracture     left  . Radial neck fracture   . Cancer (HCC)     cervical cancer  . Headache   . Pneumonia     Past Surgical History  Procedure Laterality Date  . Cholecystectomy    . Abdominal hysterectomy      partial    Family History  Problem Relation Age of Onset  . Other Mother     sepsis  . Heart attack Father   . Hypertension Sister   . Hypertension Brother   . Epilepsy Daughter   . ADD / ADHD Daughter   . Hypertension Brother    Social History:  reports that she has never smoked. She has never used smokeless tobacco. She reports that she drinks alcohol. She reports that she does not use illicit drugs.  Allergies: No Known Allergies  No prescriptions prior to admission    No results found for this or any previous visit (from the past 48 hour(s)). No results found.  Review of Systems  Constitutional: Negative.   HENT: Negative.   Eyes: Negative.   Respiratory: Negative.   Cardiovascular: Negative.   Gastrointestinal: Negative.   Genitourinary: Negative.   Musculoskeletal: Positive for joint pain.  Skin: Negative.   Neurological: Negative.   Psychiatric/Behavioral: Negative.     There were no vitals taken for this visit. Physical Exam  Constitutional: She is oriented to person, place, and time. She appears well-nourished.  HENT:  Head: Atraumatic.  Eyes: EOM are normal.  Neck: Normal range of motion.  Cardiovascular: Normal rate.   Respiratory: No respiratory distress.  GI: She exhibits  no distension.  Musculoskeletal: She exhibits tenderness (elbow).  Limited elbow ROM.  Swelling, bruising.   Neurological: She is alert and oriented to person, place, and time.  Skin: Skin is warm and dry.  Psychiatric: She has a normal mood and affect.     Assessment/Plan Left elbow fracture.  Will proceed with ORIF. Surgical procedure along with possible risks and complications discussed.  All questions answered.    Titus Drone M 11/18/2014, 7:20 AM

## 2014-11-18 NOTE — Progress Notes (Signed)
Orthopedic Tech Progress Note Patient Details:  Rhonda [name] 1960-11-10 [id number] Patient already has arm sling. Patient ID: Rhonda [name], female   DOB: October 02, 1960, 54 y.o.   MRN: [id number]   Braulio Bosch 11/18/2014, 6:48 PM

## 2014-11-18 NOTE — Op Note (Signed)
Rhonda Rhonda [name], Rhonda [name]                 ACCOUNT NO.:  [id number]  MEDICAL RECORD NO.:  [id number]  LOCATION:  5N02C                        FACILITY:  [facility]  PHYSICIAN:  Rhonda Rhonda [name], M.D.    DATE OF BIRTH:  10-11-1960  DATE OF PROCEDURE:  11/18/2014 DATE OF DISCHARGE:                              OPERATIVE REPORT   PREOPERATIVE DIAGNOSES:  Left comminuted olecranon fracture.  Radial head fracture dislocation with elbow subluxation.  POSTOPERATIVE DIAGNOSES:  Left comminuted olecranon fracture.  Radial head fracture dislocation with elbow subluxation.  PROCEDURE:  Open reduction and internal fixation of left olecranon. Radial head arthroplasty.  ANESTHESIA:  General.  COMPONENTS:  Biomet anatomic proximal olecranon plate with 10 screws. Biomet radial head replacement #8 stem, +10 height.  SURGEON:  Rhonda Rhonda [name], M.D.  ASSISTANT:  Rhonda Core, PA-C, medically necessary and present for the entire procedure.  TOURNIQUET TIME:  An hour and 15 minutes x250.  INDICATION:  This 54 year old female, tripped and fell, with comminuted ulna fracture proximally involving the olecranon and extending into the joint partially down the shaft with dislocation of the radial head and dislocation of the elbow.  She had splinting and now is brought to the operating room for operative fixation.  DESCRIPTION OF PROCEDURE:  After induction of general anesthesia, preoperative Ancef prophylaxis, standard prepping and draping with proximal arm tourniquet, impervious stockinette, Coban, towel clip proximally and extremity sheets and drapes, time-out procedure was completed.  Sterile skin marker was used followed by Betadine and Steri- Drape to seal the skin.  Arm was wrapped in Esmarch, tourniquet inflated.  Posterior incision was made over the olecranon following down the subcutaneous palpable border of the olecranon.  The triceps was peeled and fracture fragments which were multiple were carefully  reduced and held with multiple K-wires.  Plate was selected, placed.  K-wires placed to hold the plate checking under fluoroscopy.  Radial head remained dislocated and with the ulna fracture unstable, the elbow joint was still dislocated.  Olecranon was gradually put back together with home-run screw placed after distal screws were placed bicortically to pull the plate directly down against the olecranon.  Once the olecranon was reduced, home-run screw was placed, then with some difficulty, the elbow joint itself was reduced.  This still left the radial head posteriorly positioned.  It could not be reduced closed, and incision was made over the lateral epicondyle, extended distally in line with the head of the radius and neck of the radius toward  position.  Extensors were split and capsule was opened.  There was a fracture fragment removed from the radial head which was about 25% of the joint. Unfortunately, with the anterior position and allowed the head with just slight extension to pop out posteriorly.  Neck was cut.  The fragment was slightly crushed, so radial head arthroplasty was selected. Broaching up to 8 which gave nice fit.  Trials were tried and care was taken to make sure there was enough length to keep the radial head opposite the capitellum.  This allowed reduction with elbow taken out to full extension and rotated.  The radial head could still sublux but it was stable out  to 20 degrees of flexion.  Checked under C-arm and stable up to 140 degrees flexion.  The permanent prosthesis was opened, put in place, repeat irrigation and standard closure with #1 Vicryl in the deep tissues, repairing the subcutaneous tissue, periosteum and triceps together.  The extensor tendons were reapproximated, subcutaneous tissue closed, skin staple closure, and once posterior splints were applied with the elbow at 90 degrees, C-arm was brought in again to confirm that the elbow remained  reduced.  There was still some comminution with the fracture fragment over the medial aspect of the proximal olecranon, but with flexion and extension, the fragment was stable.  The patient was placed in a sling.  Preoperatively, the patient has supraclavicular lymphadenopathy and had some oxygen and IV sedation which she tolerated well.  The patient was stable enough for IV pain control.     Aracelli Woloszyn C. Lorin Rhonda [name], M.D.     MCY/MEDQ  D:  11/18/2014  T:  11/18/2014  Job:  [id number]

## 2014-11-18 NOTE — Progress Notes (Signed)
CRNA at bedside.

## 2014-11-18 NOTE — Addendum Note (Signed)
Addendum  created 11/18/14 1656 by Lillia Abed, MD   Modules edited: Clinical Notes   Clinical Notes:  File: [id number]

## 2014-11-18 NOTE — Anesthesia Preprocedure Evaluation (Addendum)
Anesthesia Evaluation  Patient identified by MRN, date of birth, ID band Patient awake    Reviewed: Allergy & Precautions, NPO status , Patient's Chart, lab work & pertinent test results  History of Anesthesia Complications (+) PONV  Airway Mallampati: I  TM Distance: >3 FB     Dental  (+) Teeth Intact, Dental Advisory Given   Pulmonary pneumonia, resolved,    Pulmonary exam normal breath sounds clear to auscultation       Cardiovascular Normal cardiovascular exam Rhythm:Regular     Neuro/Psych  Headaches, Anxiety    GI/Hepatic negative GI ROS, Neg liver ROS,   Endo/Other  negative endocrine ROS  Renal/GU negative Renal ROS     Musculoskeletal negative musculoskeletal ROS (+)   Abdominal (+)  Abdomen: soft. Bowel sounds: normal.  Peds  Hematology negative hematology ROS (+)   Anesthesia Other Findings   Reproductive/Obstetrics negative OB ROS                           Anesthesia Physical Anesthesia Plan  ASA: II  Anesthesia Plan: Regional   Post-op Pain Management:    Induction: Intravenous  Airway Management Planned: Natural Airway  Additional Equipment:   Intra-op Plan:   Post-operative Plan:   Informed Consent: I have reviewed the patients History and Physical, chart, labs and discussed the procedure including the risks, benefits and alternatives for the proposed anesthesia with the patient or authorized representative who has indicated his/her understanding and acceptance.     Plan Discussed with: CRNA and Surgeon  Anesthesia Plan Comments:        Anesthesia Quick Evaluation

## 2014-11-18 NOTE — Anesthesia Procedure Notes (Signed)
Anesthesia Regional Block:  Supraclavicular block  Pre-Anesthetic Checklist: ,, timeout performed, Correct Patient, Correct Site, Correct Laterality, Correct Procedure, Correct Position, site marked, Risks and benefits discussed,  Surgical consent,  Pre-op evaluation,  At surgeon's request and post-op pain management  Laterality: Left  Prep: chloraprep       Needles:  Injection technique: Single-shot  Needle Type: Echogenic Stimulator Needle     Needle Length: 9cm 9 cm Needle Gauge: 21 and 21 G    Additional Needles:  Procedures: ultrasound guided (picture in chart) and nerve stimulator Supraclavicular block  Nerve Stimulator or Paresthesia:  Response: 0.4 mA,   Additional Responses:   Narrative:  Start time: 11/18/2014 9:35 AM End time: 11/18/2014 9:45 AM Injection made incrementally with aspirations every 5 mL.  Performed by: Personally  Anesthesiologist: Lillia Abed  Additional Notes: Monitors applied. Patient sedated. Sterile prep and drape,hand hygiene and sterile gloves were used. Relevant anatomy identified.Needle position confirmed.Local anesthetic injected incrementally after negative aspiration. Local anesthetic spread visualized around nerve(s). Vascular puncture avoided. No complications. Image printed for medical record.The patient tolerated the procedure well.

## 2014-11-18 NOTE — Transfer of Care (Signed)
Immediate Anesthesia Transfer of Care Note  Patient: Rhonda [name]  Procedure(s) Performed: Procedure(s): OPEN REDUCTION INTERNAL FIXATION (ORIF) LEFT OLECRANON, OPEN REDUCTION INTERNAL FIXATION LEFT RADIAL NECK FRACTURE (Left)  Patient Location: PACU  Anesthesia Type:MAC combined with regional for post-op pain  Level of Consciousness: awake, alert  and oriented  Airway & Oxygen Therapy: Patient Spontanous Breathing  Post-op Assessment: Report given to RN and Post -op Vital signs reviewed and stable  Post vital signs: stable  Last Vitals:  Filed Vitals:   11/18/14 0945  BP: 113/61  Pulse: 89  Temp:   Resp: 14    Complications: No apparent anesthesia complications

## 2014-11-19 DIAGNOSIS — S52032A Displaced fracture of olecranon process with intraarticular extension of left ulna, initial encounter for closed fracture: Secondary | ICD-10-CM | POA: Diagnosis not present

## 2014-11-19 MED ORDER — ONDANSETRON HCL 4 MG PO TABS: 4.0000 mg | ORAL_TABLET | Freq: Three times a day (TID) | ORAL | Status: DC | PRN

## 2014-11-19 NOTE — Progress Notes (Signed)
Patient ID: Rhonda [name], female   DOB: December 30, 1960, 54 y.o.   MRN: [id number] Status post open reduction internal fixation left elbow. Patient complains of the Ace wrap being too tight. This was removed and reapplied patient states that her elbow and arm felt comfortable. Patient states she did have some nausea from the Percocet. A prescription was also provided for Zofran. Plan for discharge to home.

## 2014-11-19 NOTE — Care Management Note (Signed)
Case Management Note  Patient Details  Name: JACKQULINE BRANCA MRN: [id number] Date of Birth: [birth year]  Subjective/Objective:                  post open reduction internal fixation left elbow.  Action/Plan: CM called and spoke with pt regarding discharge home. Patient states that she has the support of family at home and does not need additional assistance. Pt declines and home health needs. No DME needed per pt. Pt states that she has no difficulty obtaining medications and is planning for discharge home today.   Expected Discharge Date:  11/19/14           Expected Discharge Plan:  Home/Self Care  In-House Referral:     Discharge planning Services  CM Consult  Post Acute Care Choice:    Choice offered to:     DME Arranged:    DME Agency:     HH Arranged:    HH Agency:     Status of Service:  In process, will continue to follow  Medicare Important Message Given:    Date Medicare IM Given:    Medicare IM give by:    Date Additional Medicare IM Given:    Additional Medicare Important Message give by:     If discussed at [place] of Stay Meetings, dates discussed:    Additional Comments:  Guido Sander, RN 11/19/2014, 2:31 PM

## 2014-11-21 ENCOUNTER — Encounter (HOSPITAL_COMMUNITY): Payer: Self-pay | Admitting: Orthopaedic Surgery

## 2014-12-02 NOTE — Discharge Summary (Signed)
Patient ID: Rhonda [name] MRN: [id number] DOB/AGE: May 18, 1960 54 y.o.  Admit date: 11/18/2014 Discharge date: 12/02/2014  Admission Diagnoses:  Active Problems:   Elbow fracture, left   Discharge Diagnoses:  Active Problems:   Elbow fracture, left  status post Procedure(s): OPEN REDUCTION INTERNAL FIXATION (ORIF) LEFT OLECRANON, OPEN REDUCTION INTERNAL FIXATION LEFT RADIAL NECK FRACTURE  Past Medical History  Diagnosis Date  . Anxiety   . Allergy     seasonal  . PONV (postoperative nausea and vomiting)   . Closed olecranon fracture     left  . Radial neck fracture   . Cancer (HCC)     cervical cancer  . Headache   . Pneumonia     Surgeries: Procedure(s): OPEN REDUCTION INTERNAL FIXATION (ORIF) LEFT OLECRANON, OPEN REDUCTION INTERNAL FIXATION LEFT RADIAL NECK FRACTURE on 11/18/2014   Consultants:    Discharged Condition: Improved  Hospital Course: Rhonda [name] is an 54 y.o. female who was admitted 11/18/2014 for operative treatment of left olecranon and radial neck fractures. Patient failed conservative treatments (please see the history and physical for the specifics) and had severe unremitting pain that affects sleep, daily activities and work/hobbies. After pre-op clearance, the patient was taken to the operating room on 11/18/2014 and underwent  Procedure(s): OPEN REDUCTION INTERNAL FIXATION (ORIF) LEFT OLECRANON, OPEN REDUCTION INTERNAL FIXATION LEFT RADIAL NECK FRACTURE.    Patient was given perioperative antibiotics:  Anti-infectives    Start     Dose/Rate Route Frequency Ordered Stop   11/18/14 1600  ceFAZolin (ANCEF) IVPB 1 g/50 mL premix     1 g 100 mL/hr over 30 Minutes Intravenous Every 6 hours 11/18/14 1428 11/18/14 2140   11/18/14 0804  ceFAZolin (ANCEF) IVPB 2 g/50 mL premix     2 g 100 mL/hr over 30 Minutes Intravenous On call to O.R. 11/18/14 0804 11/18/14 1042       Patient was given sequential compression devices and early ambulation to  prevent DVT.   Patient benefited maximally from hospital stay and there were no complications. At the time of discharge, the patient was urinating/moving their bowels without difficulty, tolerating a regular diet, pain is controlled with oral pain medications and they have been cleared by PT/OT.   Recent vital signs: No data found.    Recent laboratory studies: No results for input(s): WBC, HGB, HCT, PLT, NA, K, CL, CO2, BUN, CREATININE, GLUCOSE, INR, CALCIUM in the last 72 hours.  Invalid input(s): PT, 2   Discharge Medications:     Medication List    STOP taking these medications        AIRBORNE PO     multivitamin tablet      TAKE these medications        CLARITIN-D 24 HOUR PO  Take 1 tablet by mouth every morning.     escitalopram 20 MG tablet  Commonly known as:  LEXAPRO  Take 1 tablet (20 mg total) by mouth daily.     methocarbamol 500 MG tablet  Commonly known as:  ROBAXIN  Take 1 tablet (500 mg total) by mouth every 6 (six) hours as needed for muscle spasms.     ondansetron 4 MG tablet  Commonly known as:  ZOFRAN  Take 1 tablet (4 mg total) by mouth every 8 (eight) hours as needed for nausea or vomiting.     oxyCODONE-acetaminophen 5-325 MG tablet  Commonly known as:  PERCOCET/ROXICET  Take 1-2 tablets by mouth every 4 (four) hours as needed.  Diagnostic Studies: Dg Chest 2 View  11/18/2014  CLINICAL DATA:  Preop chest.  Olecranon fracture. EXAM: CHEST  2 VIEW COMPARISON:  10/23/2012 FINDINGS: The heart size and mediastinal contours are within normal limits. Both lungs are clear. The visualized skeletal structures are unremarkable. IMPRESSION: No active cardiopulmonary disease. Electronically Signed   By: Rolm Baptise M.D.   On: 11/18/2014 09:30   Dg Elbow 2 Views Left  11/13/2014  CLINICAL DATA:  Status post reduction of left elbow dislocation EXAM: LEFT ELBOW - 2 VIEW COMPARISON:  None. FINDINGS: There is a comminuted, mildly displaced proximal  left ulnar fracture. There is interval reduction of ulnohumeral dislocation. There is a fracture of the left radial head which is dorsally dislocated. There is a small fracture fragment which is displaced anteriorly arising from the left radial head. There is a large joint effusion. IMPRESSION: 1. Comminuted, mildly displaced proximal left ulnar fracture. There is interval reduction of ulnohumeral dislocation. 2. Fracture of the left radial head which is dorsally dislocated. Electronically Signed   By: Kathreen Devoid   On: 11/13/2014 00:53   Dg Elbow Complete Left  11/18/2014  CLINICAL DATA:  Fracture fixation EXAM: DG C-ARM 61-120 MIN; LEFT ELBOW - COMPLETE 3+ VIEW COMPARISON:  11/12/2014 FINDINGS: Olecranon fracture has been repaired with a plate and screws and appears to be in satisfactory alignment. Resection of proximal radius with radial head prosthesis in good position. On the lateral view, there is mild subluxation of the ulna with widening of the elbow joint. IMPRESSION: Plate and screw fixation of olecranon fracture. Radial head resection with prosthesis placement. Widening of the joint space compatible with subluxation of the elbow. Electronically Signed   By: Franchot Gallo M.D.   On: 11/18/2014 12:32   Dg Elbow Complete Left  11/12/2014  CLINICAL DATA:  Status post fall.  Ulnar pain. EXAM: LEFT ELBOW - COMPLETE 3+ VIEW COMPARISON:  None. FINDINGS: There is a comminuted fracture of the proximal left ulna with posterior dislocation. There is a nondisplaced fracture of the volar aspect of the left radial head with posterior dislocation. There is no other fracture. IMPRESSION: Comminuted fracture of the proximal left ulna with posterior dislocation. Nondisplaced fracture of the volar aspect of the left radial head with posterior dislocation. Electronically Signed   By: Kathreen Devoid   On: 11/12/2014 22:34   Dg C-arm 61-120 Min  11/18/2014  CLINICAL DATA:  Fracture fixation EXAM: DG C-ARM 61-120  MIN; LEFT ELBOW - COMPLETE 3+ VIEW COMPARISON:  11/12/2014 FINDINGS: Olecranon fracture has been repaired with a plate and screws and appears to be in satisfactory alignment. Resection of proximal radius with radial head prosthesis in good position. On the lateral view, there is mild subluxation of the ulna with widening of the elbow joint. IMPRESSION: Plate and screw fixation of olecranon fracture. Radial head resection with prosthesis placement. Widening of the joint space compatible with subluxation of the elbow. Electronically Signed   By: Franchot Gallo M.D.   On: 11/18/2014 12:32        Discharge Instructions    Call MD / Call 911    Complete by:  As directed   If you experience chest pain or shortness of breath, CALL 911 and be transported to the hospital emergency room.  If you develope a fever above 101 F, pus (white drainage) or increased drainage or redness at the wound, or calf pain, call your surgeon's office.     Call MD / Call 911  Complete by:  As directed   If you experience chest pain or shortness of breath, CALL 911 and be transported to the hospital emergency room.  If you develope a fever above 101 F, pus (white drainage) or increased drainage or redness at the wound, or calf pain, call your surgeon's office.     Constipation Prevention    Complete by:  As directed   Drink plenty of fluids.  Prune juice may be helpful.  You may use a stool softener, such as Colace (over the counter) 100 mg twice a day.  Use MiraLax (over the counter) for constipation as needed.     Constipation Prevention    Complete by:  As directed   Drink plenty of fluids.  Prune juice may be helpful.  You may use a stool softener, such as Colace (over the counter) 100 mg twice a day.  Use MiraLax (over the counter) for constipation as needed.     Diet - low sodium heart healthy    Complete by:  As directed      Diet - low sodium heart healthy    Complete by:  As directed      Discharge instructions     Complete by:  As directed   Do not remove dressing/splint or get wet.  Do not left arm.  Wear sling.     Driving restrictions    Complete by:  As directed   No driving     Increase activity slowly as tolerated    Complete by:  As directed      Increase activity slowly as tolerated    Complete by:  As directed      Lifting restrictions    Complete by:  As directed   No lifting           Follow-up Information    Schedule an appointment as soon as possible for a visit with Marybelle Killings, MD.   Specialty:  Orthopedic Surgery   Why:  need return office visit one week.    Contact information:   [place] [address] [phone]       Discharge Plan:  discharge to home  Disposition:     Signed: Lanae Crumbly  12/02/2014, 11:57 AM

## 2015-05-31 ENCOUNTER — Other Ambulatory Visit: Payer: Self-pay | Admitting: Adult Health

## 2016-05-23 ENCOUNTER — Other Ambulatory Visit: Payer: Self-pay | Admitting: Adult Health

## 2016-08-23 ENCOUNTER — Encounter (INDEPENDENT_AMBULATORY_CARE_PROVIDER_SITE_OTHER): Payer: Self-pay

## 2016-08-23 ENCOUNTER — Encounter: Payer: Self-pay | Admitting: Adult Health

## 2016-08-23 ENCOUNTER — Ambulatory Visit (INDEPENDENT_AMBULATORY_CARE_PROVIDER_SITE_OTHER): Payer: BLUE CROSS/BLUE SHIELD | Admitting: Adult Health

## 2016-08-23 ENCOUNTER — Other Ambulatory Visit (HOSPITAL_COMMUNITY)
Admission: RE | Admit: 2016-08-23 | Discharge: 2016-08-23 | Disposition: A | Discharge status: Home / Self Care | Payer: BLUE CROSS/BLUE SHIELD | Source: Ambulatory Visit | Attending: Adult Health | Admitting: Adult Health

## 2016-08-23 VITALS — BP 120/72 | HR 71 | Ht 67.0 in | Wt 210.5 lb

## 2016-08-23 DIAGNOSIS — Z1321 Encounter for screening for nutritional disorder: Secondary | ICD-10-CM

## 2016-08-23 DIAGNOSIS — Z01419 Encounter for gynecological examination (general) (routine) without abnormal findings: Secondary | ICD-10-CM | POA: Insufficient documentation

## 2016-08-23 DIAGNOSIS — Z08 Encounter for follow-up examination after completed treatment for malignant neoplasm: Secondary | ICD-10-CM

## 2016-08-23 DIAGNOSIS — Z131 Encounter for screening for diabetes mellitus: Secondary | ICD-10-CM

## 2016-08-23 DIAGNOSIS — Z90710 Acquired absence of both cervix and uterus: Secondary | ICD-10-CM | POA: Insufficient documentation

## 2016-08-23 DIAGNOSIS — Z1211 Encounter for screening for malignant neoplasm of colon: Secondary | ICD-10-CM | POA: Diagnosis not present

## 2016-08-23 DIAGNOSIS — R87629 Unspecified abnormal cytological findings in specimens from vagina: Secondary | ICD-10-CM | POA: Insufficient documentation

## 2016-08-23 DIAGNOSIS — Z13220 Encounter for screening for lipoid disorders: Secondary | ICD-10-CM

## 2016-08-23 DIAGNOSIS — Z1212 Encounter for screening for malignant neoplasm of rectum: Secondary | ICD-10-CM | POA: Diagnosis not present

## 2016-08-23 DIAGNOSIS — R635 Abnormal weight gain: Secondary | ICD-10-CM

## 2016-08-23 DIAGNOSIS — K5900 Constipation, unspecified: Secondary | ICD-10-CM | POA: Insufficient documentation

## 2016-08-23 LAB — HEMOCCULT GUIAC POC 1CARD (OFFICE): FECAL OCCULT BLD: NEGATIVE

## 2016-08-23 NOTE — Addendum Note (Signed)
Addended by: Linton Rump on: 08/23/2016 10:05 AM   Modules accepted: Orders

## 2016-08-23 NOTE — Progress Notes (Signed)
Patient ID: Rhonda [name], female   DOB: 09-28-1960, 56 y.o.   MRN: [id number] History of Present Illness: Rhonda [name] is a 56 year old white female, divorced in for a well woman gyn exam and pap, she had hysterectomy for CIS about 25 years ago, last pap was 01/16/12 normal with negative HPV.She stopped her lexapro, about 2 weeks ago for weight gain and esp constipation, and is going fine now she says. She still works for [employer] in [place].She fractured left elbow last year and had to have surgery.  No PCP, sees urgent care if has issue.    Current Medications, Allergies, Past Medical History, Past Surgical History, Family History and Social History were reviewed in [facility] record.     Review of Systems:  Patient denies any headaches, hearing loss, fatigue, blurred vision, shortness of breath, chest pain, abdominal pain, problems with urination, or intercourse(not having sex). No joint pain or mood swings. +weight gain, +constipation  Physical Exam:BP 120/72 (BP Location: Left Arm, Patient Position: Sitting, Cuff Size: Large)   Pulse 71   Ht 5\' 7"  (1.702 m)   Wt 210 lb 8 oz (95.5 kg)   BMI 32.97 kg/m  General:  Well developed, well nourished, no acute distress Skin:  Warm and dry Neck:  Midline trachea, normal thyroid, good ROM, no lymphadenopathy Lungs; Clear to auscultation bilaterally Breast:  No dominant palpable mass, retraction, or nipple discharge Cardiovascular: Regular rate and rhythm Abdomen:  Soft, non tender, no hepatosplenomegaly Pelvic:  External genitalia is normal in appearance, no lesions.  The vagina is normal in appearance. Urethra has no lesions or masses. The cervix and uterus are absent.  No adnexal masses or tenderness noted.Bladder is non tender, no masses felt. Rectal: Good sphincter tone, no polyps, or hemorrhoids felt.  Hemoccult negative. Extremities/musculoskeletal:  No swelling or varicosities noted, no clubbing or  cyanosis Psych:  No mood changes, alert and cooperative,seems happy PHQ 2 score 0.Will check labs, encouraged her to get PCP.   Impression: 1. Encounter for well woman exam with routine gynecological exam   2. Vaginal Pap smear following hysterectomy for malignancy   3. Weight gain   4. Constipation, unspecified constipation type   5. Screening for colorectal cancer   6. Screening cholesterol level   7. Screening for diabetes mellitus   8. Encounter for vitamin deficiency screening       Plan:  Check CBC,CMP,TSH and lipids,A1c, vitamin D and HIV and Hepatitis C  Mammogram now and yearly Colonoscopy advised Physical in 1 year Pap in 3 if normal

## 2016-08-24 LAB — CBC
HEMOGLOBIN: 13.2 g/dL (ref 11.1–15.9)
Hematocrit: 40.3 % (ref 34.0–46.6)
MCH: 27.9 pg (ref 26.6–33.0)
MCHC: 32.8 g/dL (ref 31.5–35.7)
MCV: 85 fL (ref 79–97)
Platelets: 344 10*3/uL (ref 150–379)
RBC: 4.73 x10E6/uL (ref 3.77–5.28)
RDW: 14 % (ref 12.3–15.4)
WBC: 4.5 10*3/uL (ref 3.4–10.8)

## 2016-08-24 LAB — LIPID PANEL
CHOL/HDL RATIO: 3.8 ratio (ref 0.0–4.4)
CHOLESTEROL TOTAL: 249 mg/dL — AB (ref 100–199)
HDL: 65 mg/dL (ref >39)
LDL Calculated: 152 mg/dL — ABNORMAL HIGH (ref 0–99)
Triglycerides: 160 mg/dL — ABNORMAL HIGH (ref 0–149)
VLDL CHOLESTEROL CAL: 32 mg/dL (ref 5–40)

## 2016-08-24 LAB — TSH: TSH: 2.67 u[IU]/mL (ref 0.450–4.500)

## 2016-08-24 LAB — COMPREHENSIVE METABOLIC PANEL
ALBUMIN: 4.5 g/dL (ref 3.5–5.5)
ALT: 18 IU/L (ref 0–32)
AST: 17 IU/L (ref 0–40)
Albumin/Globulin Ratio: 1.7 (ref 1.2–2.2)
Alkaline Phosphatase: 95 IU/L (ref 39–117)
BILIRUBIN TOTAL: 0.3 mg/dL (ref 0.0–1.2)
BUN / CREAT RATIO: 9 (ref 9–23)
BUN: 7 mg/dL (ref 6–24)
CALCIUM: 9.2 mg/dL (ref 8.7–10.2)
CO2: 23 mmol/L (ref 20–29)
CREATININE: 0.81 mg/dL (ref 0.57–1.00)
Chloride: 103 mmol/L (ref 96–106)
GFR, EST AFRICAN AMERICAN: 93 mL/min/{1.73_m2} (ref >59)
GFR, EST NON AFRICAN AMERICAN: 81 mL/min/{1.73_m2} (ref >59)
GLUCOSE: 93 mg/dL (ref 65–99)
Globulin, Total: 2.7 g/dL (ref 1.5–4.5)
Potassium: 4.3 mmol/L (ref 3.5–5.2)
Sodium: 141 mmol/L (ref 134–144)
TOTAL PROTEIN: 7.2 g/dL (ref 6.0–8.5)

## 2016-08-24 LAB — HEMOGLOBIN A1C
ESTIMATED AVERAGE GLUCOSE: 111 mg/dL
HEMOGLOBIN A1C: 5.5 % (ref 4.8–5.6)

## 2016-08-24 LAB — HIV ANTIBODY (ROUTINE TESTING W REFLEX): HIV Screen 4th Generation wRfx: NONREACTIVE

## 2016-08-24 LAB — VITAMIN D 25 HYDROXY (VIT D DEFICIENCY, FRACTURES): VIT D 25 HYDROXY: 13.7 ng/mL — AB (ref 30.0–100.0)

## 2016-08-24 LAB — HEPATITIS C ANTIBODY: Hep C Virus Ab: 0.1 s/co ratio (ref 0.0–0.9)

## 2016-08-27 ENCOUNTER — Telehealth: Payer: Self-pay | Admitting: Adult Health

## 2016-08-27 LAB — CYTOLOGY - PAP
Diagnosis: NEGATIVE
HPV: NOT DETECTED

## 2016-08-27 NOTE — Telephone Encounter (Signed)
Left message to call me about labs

## 2016-08-28 ENCOUNTER — Telehealth: Payer: Self-pay | Admitting: Adult Health

## 2016-08-28 ENCOUNTER — Encounter: Payer: Self-pay | Admitting: Adult Health

## 2016-08-28 DIAGNOSIS — E559 Vitamin D deficiency, unspecified: Secondary | ICD-10-CM

## 2016-08-28 HISTORY — DX: Vitamin D deficiency, unspecified: E55.9

## 2016-08-28 MED ORDER — CHOLECALCIFEROL 125 MCG (5000 UT) PO CAPS: 5000.0000 [IU] | ORAL_CAPSULE | Freq: Every day | ORAL | Status: DC

## 2016-08-28 NOTE — Telephone Encounter (Signed)
Pt aware of labs and pap, take vitamin D 3 5000 IU daily and decrease carbs and increase activity

## 2016-10-22 ENCOUNTER — Telehealth: Payer: Self-pay | Admitting: Adult Health

## 2016-10-22 ENCOUNTER — Other Ambulatory Visit: Payer: Self-pay | Admitting: Adult Health

## 2016-10-22 NOTE — Telephone Encounter (Signed)
Pt requests lexapro to be refilled, done

## 2017-06-14 IMAGING — DX DG ELBOW COMPLETE 3+V*L*
4 series · 4 of 4 positions shown · non-contrast
Comparison: None.

CLINICAL DATA: Status post fall.  Ulnar pain.

EXAM:
LEFT ELBOW - COMPLETE 3+ VIEW

[elbow ap]
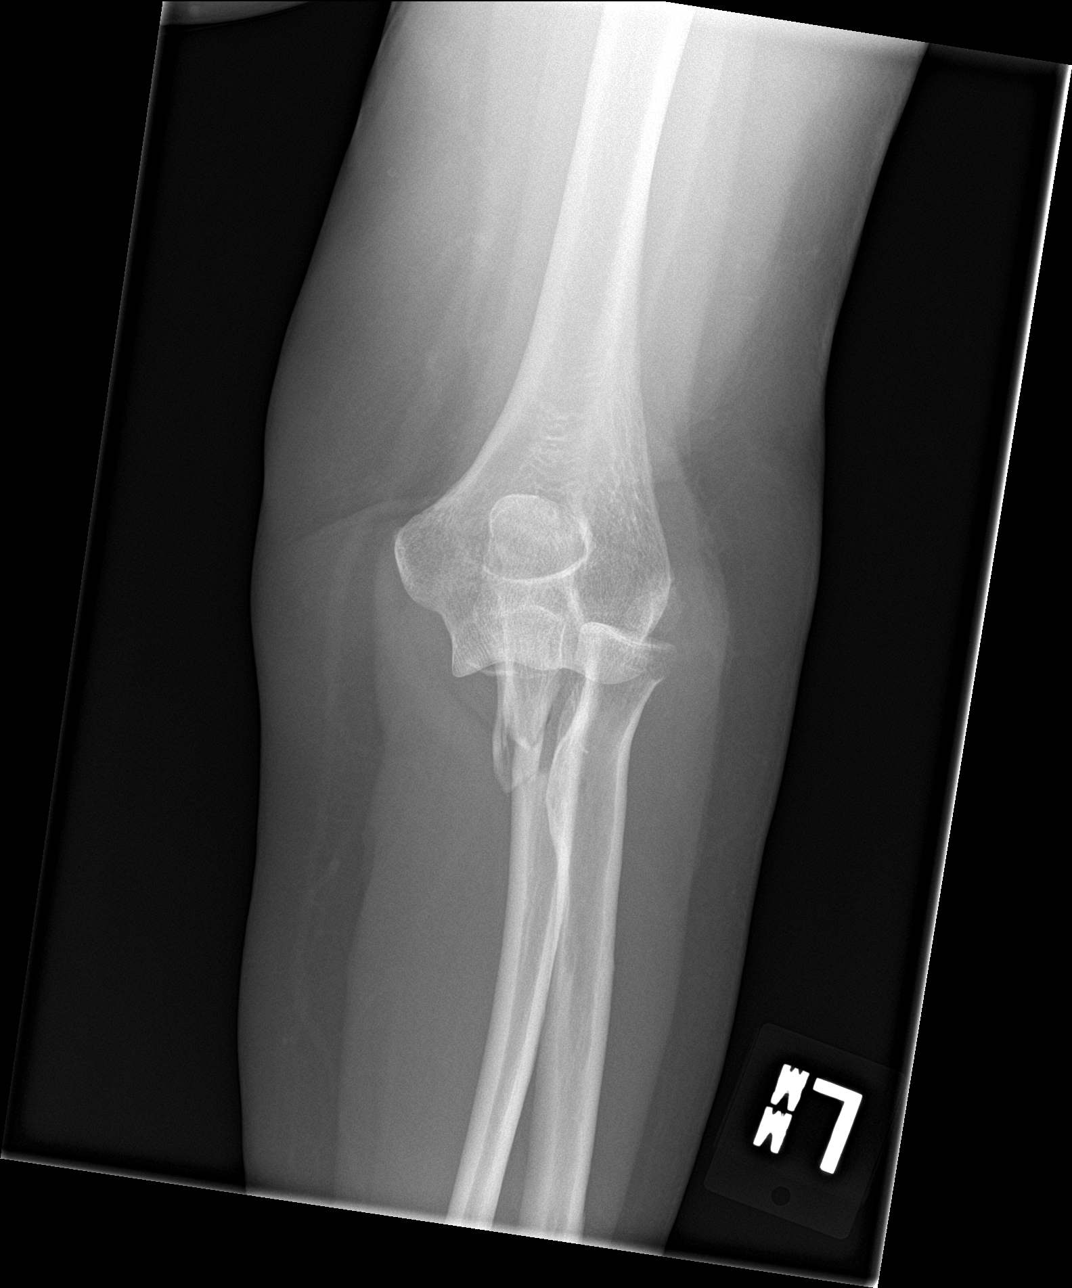

[elbow obl (1 of 2)]
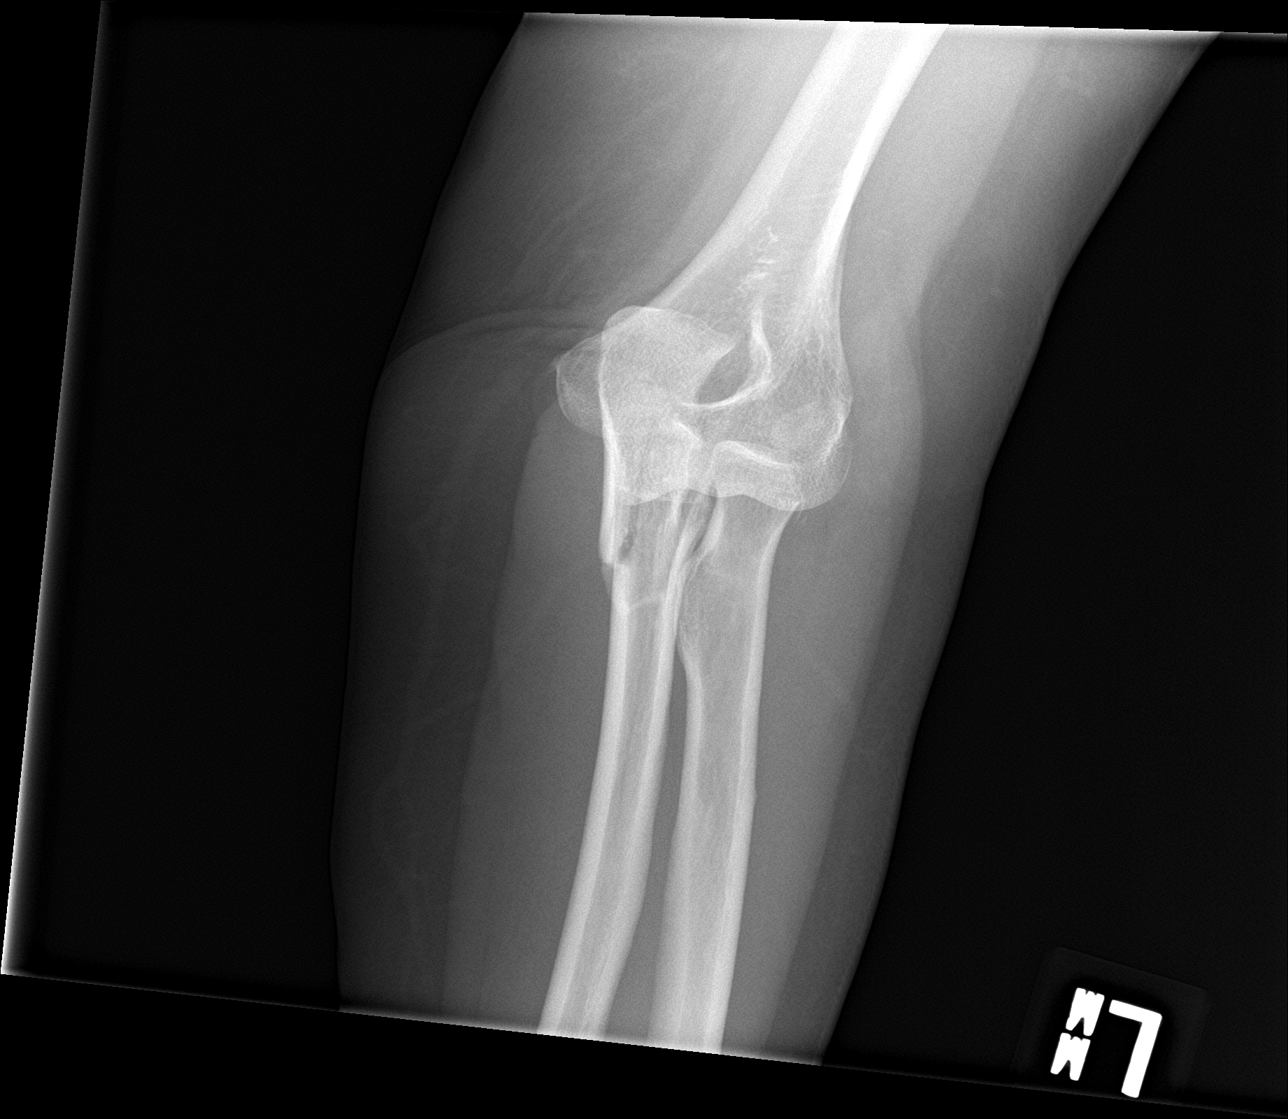

[elbow lat]
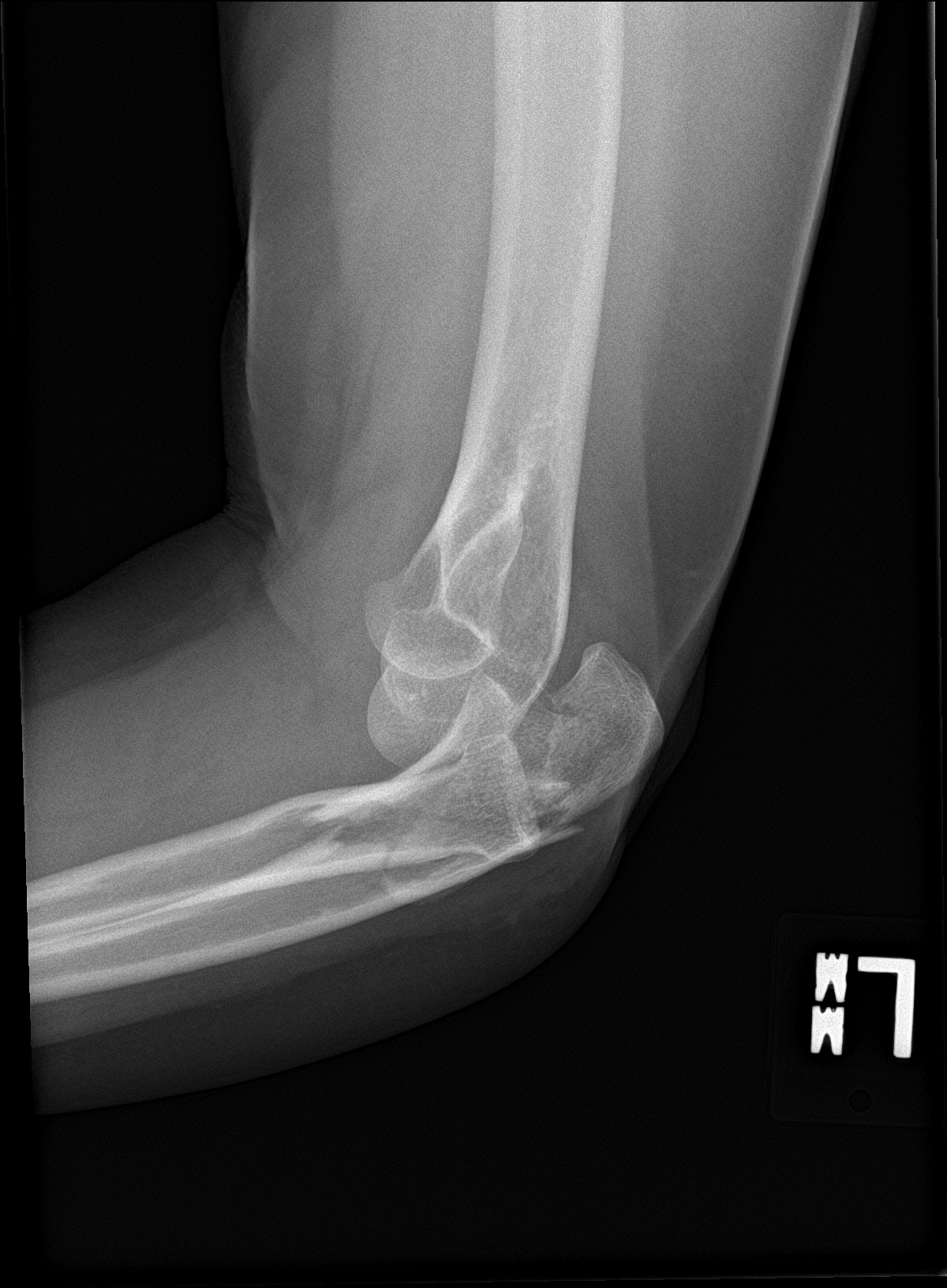

[elbow obl (2 of 2)]
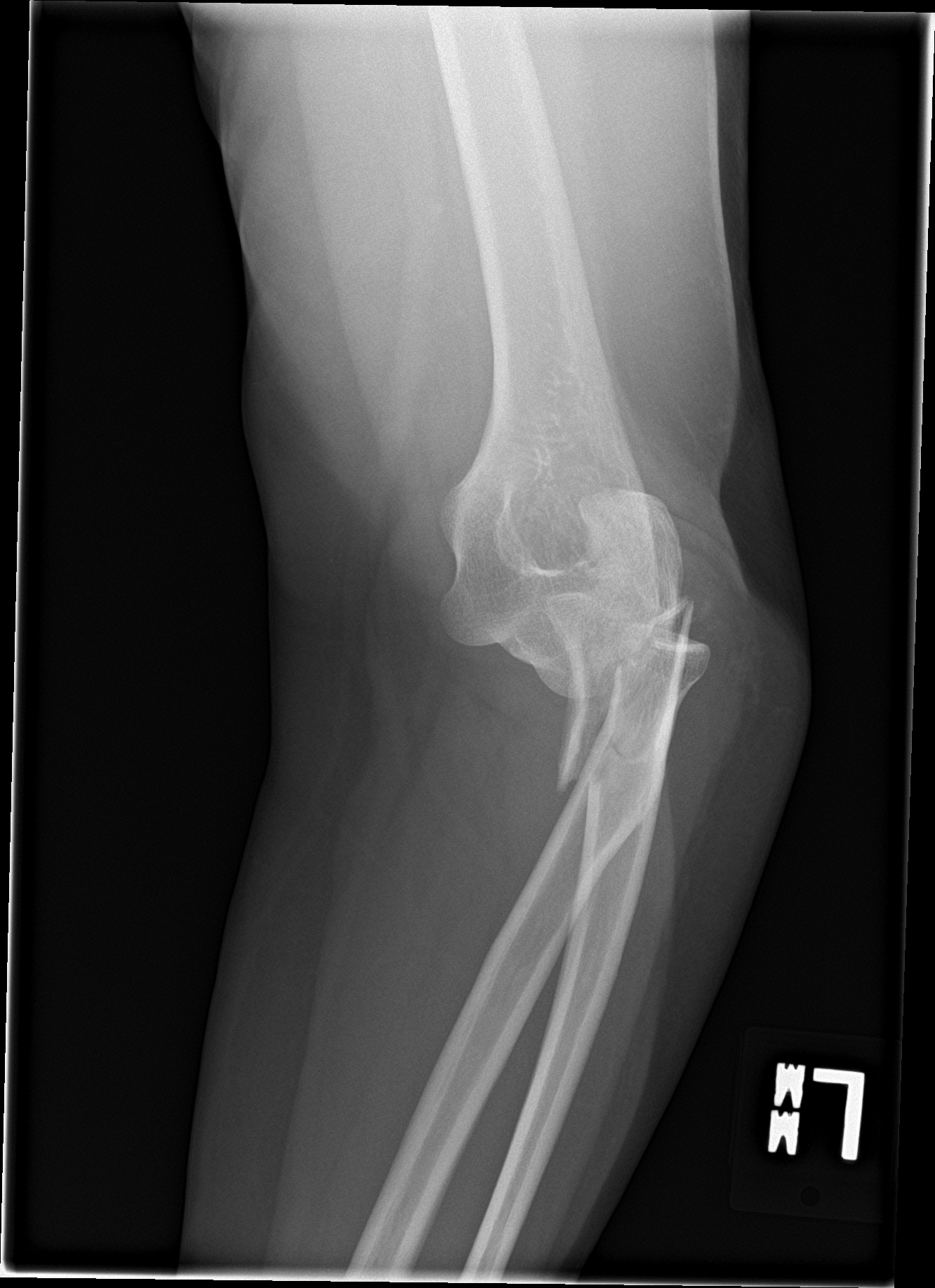

[4 of 4 positions shown; findings below may reference images not displayed]

FINDINGS: There is a comminuted fracture of the proximal left ulna with
posterior dislocation.

There is a nondisplaced fracture of the volar aspect of the left
radial head with posterior dislocation.

There is no other fracture.
IMPRESSION: Comminuted fracture of the proximal left ulna with posterior
dislocation.

Nondisplaced fracture of the volar aspect of the left radial head
with posterior dislocation.

## 2017-06-20 IMAGING — RF DG C-ARM 61-120 MIN
1 series · 4 of 4 positions shown · non-contrast
Comparison: 11/12/2014

CLINICAL DATA: Fracture fixation

EXAM:
DG C-ARM 61-120 MIN; LEFT ELBOW - COMPLETE 3+ VIEW

[Series 1: run · 4 of 4 slices shown]
[im 1/4]
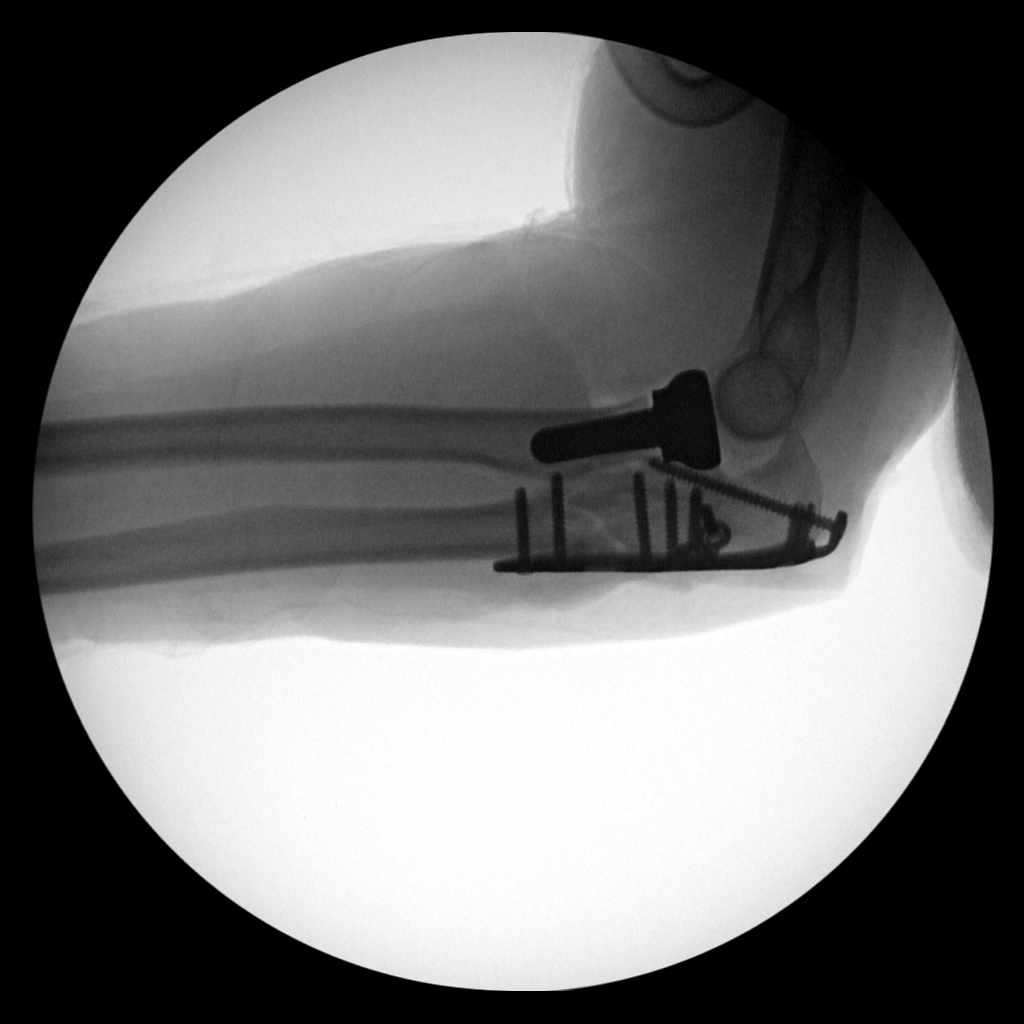
[im 2/4]
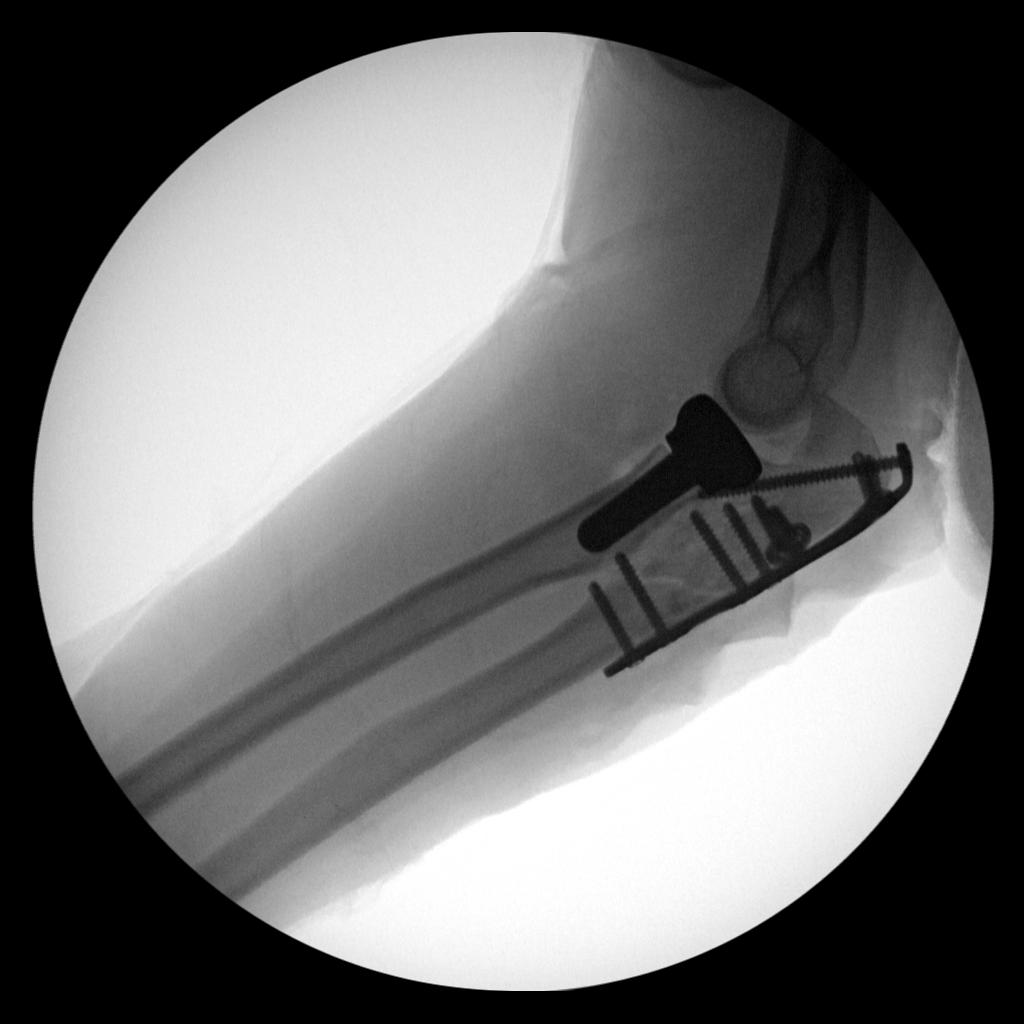
[im 3/4]
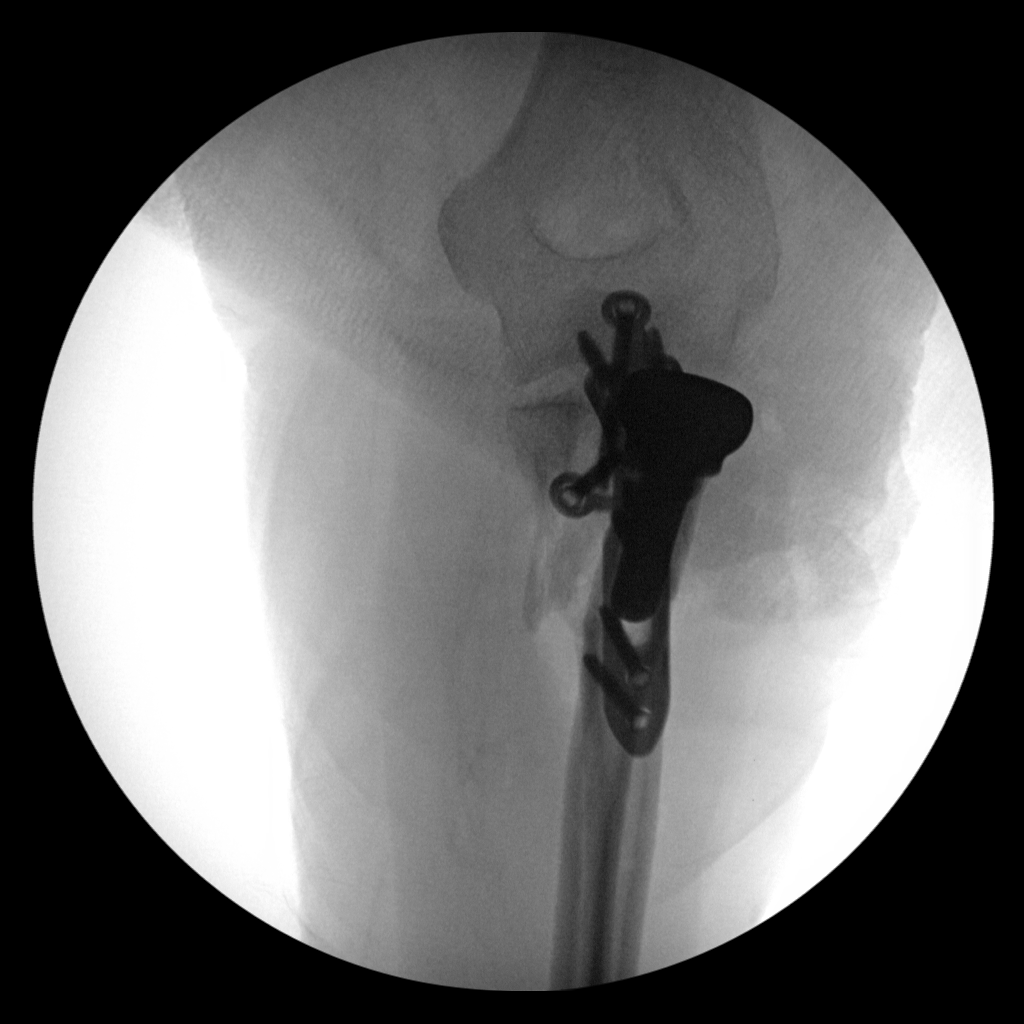
[im 4/4]
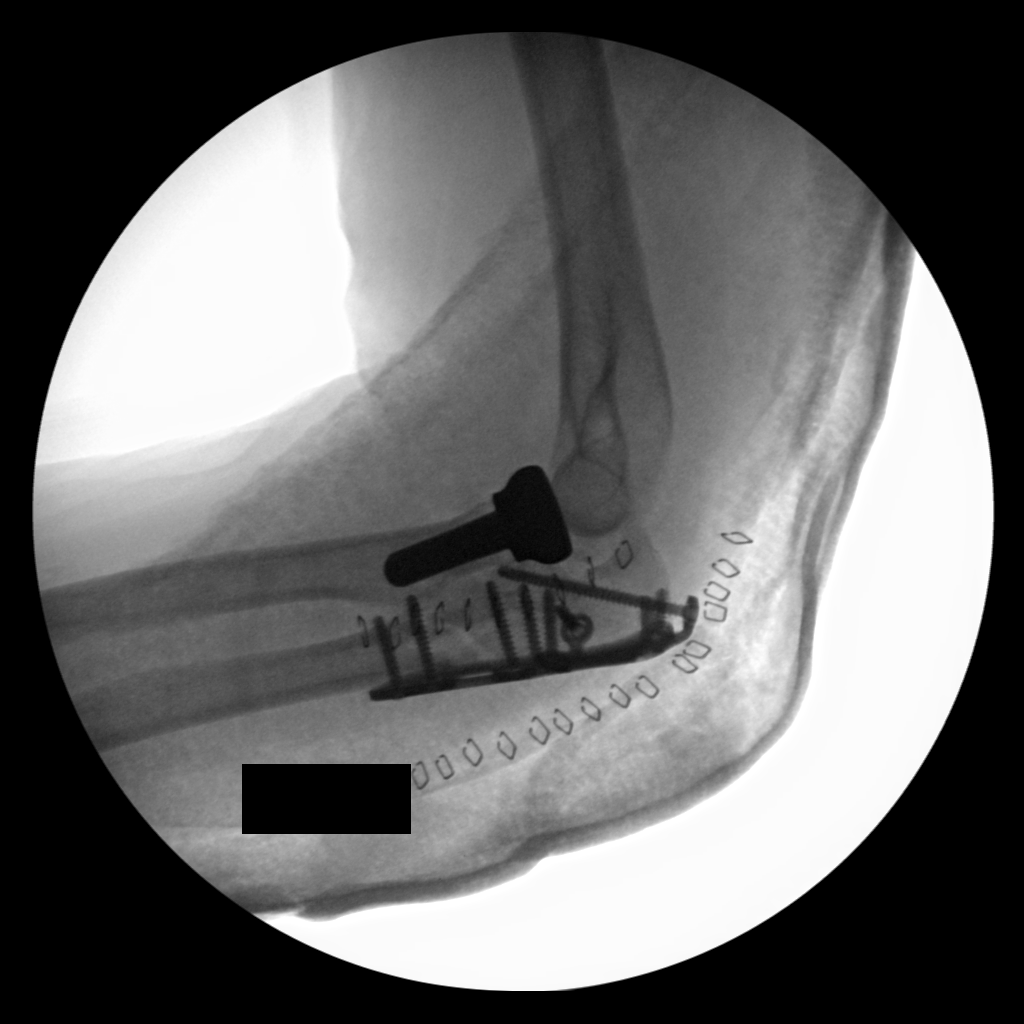

[4 of 4 positions shown; findings below may reference images not displayed]

FINDINGS: Olecranon fracture has been repaired with a plate and screws and
appears to be in satisfactory alignment. Resection of proximal
radius with radial head prosthesis in good position. On the lateral
view, there is mild subluxation of the ulna with widening of the
elbow joint.
IMPRESSION: Plate and screw fixation of olecranon fracture. Radial head
resection with prosthesis placement.

Widening of the joint space compatible with subluxation of the
elbow.

## 2017-11-08 ENCOUNTER — Other Ambulatory Visit: Payer: Self-pay | Admitting: Adult Health

## 2018-11-13 ENCOUNTER — Other Ambulatory Visit: Payer: Self-pay | Admitting: Adult Health

## 2019-03-11 ENCOUNTER — Other Ambulatory Visit: Payer: Self-pay | Admitting: Adult Health

## 2019-04-23 ENCOUNTER — Telehealth: Payer: Self-pay | Admitting: *Deleted

## 2019-04-23 ENCOUNTER — Telehealth: Payer: Self-pay | Admitting: Adult Health

## 2019-04-23 MED ORDER — AMOXICILLIN 500 MG PO CAPS: 500.0000 mg | ORAL_CAPSULE | Freq: Three times a day (TID) | ORAL | 0 refills | Status: DC

## 2019-04-23 NOTE — Telephone Encounter (Signed)
Pt requesting antibiotics for a toothache.

## 2019-04-23 NOTE — Telephone Encounter (Signed)
Pt has toothache, has appt with dentist Monday will rx ampicillin

## 2019-04-23 NOTE — Telephone Encounter (Signed)
Patient is requesting an antibiotic for a toothache.  She has tried to contact her dental office but they are closed.  Wants to know if you would do this for her.

## 2019-05-03 ENCOUNTER — Encounter: Payer: Self-pay | Admitting: Adult Health

## 2019-05-03 ENCOUNTER — Other Ambulatory Visit (HOSPITAL_COMMUNITY)
Admission: RE | Admit: 2019-05-03 | Discharge: 2019-05-03 | Disposition: A | Discharge status: Home / Self Care | Payer: BLUE CROSS/BLUE SHIELD | Source: Ambulatory Visit | Attending: Adult Health | Admitting: Adult Health

## 2019-05-03 ENCOUNTER — Ambulatory Visit (INDEPENDENT_AMBULATORY_CARE_PROVIDER_SITE_OTHER): Payer: BLUE CROSS/BLUE SHIELD | Admitting: Adult Health

## 2019-05-03 ENCOUNTER — Other Ambulatory Visit: Payer: Self-pay

## 2019-05-03 VITALS — BP 125/74 | HR 79 | Ht 67.0 in | Wt 214.5 lb

## 2019-05-03 DIAGNOSIS — Z1321 Encounter for screening for nutritional disorder: Secondary | ICD-10-CM

## 2019-05-03 DIAGNOSIS — Z90710 Acquired absence of both cervix and uterus: Secondary | ICD-10-CM

## 2019-05-03 DIAGNOSIS — Z08 Encounter for follow-up examination after completed treatment for malignant neoplasm: Secondary | ICD-10-CM

## 2019-05-03 DIAGNOSIS — Z01419 Encounter for gynecological examination (general) (routine) without abnormal findings: Secondary | ICD-10-CM | POA: Diagnosis not present

## 2019-05-03 DIAGNOSIS — Z1212 Encounter for screening for malignant neoplasm of rectum: Secondary | ICD-10-CM

## 2019-05-03 DIAGNOSIS — B009 Herpesviral infection, unspecified: Secondary | ICD-10-CM

## 2019-05-03 DIAGNOSIS — Z1211 Encounter for screening for malignant neoplasm of colon: Secondary | ICD-10-CM

## 2019-05-03 DIAGNOSIS — F419 Anxiety disorder, unspecified: Secondary | ICD-10-CM | POA: Diagnosis not present

## 2019-05-03 DIAGNOSIS — J302 Other seasonal allergic rhinitis: Secondary | ICD-10-CM

## 2019-05-03 DIAGNOSIS — Z131 Encounter for screening for diabetes mellitus: Secondary | ICD-10-CM

## 2019-05-03 LAB — HEMOCCULT GUIAC POC 1CARD (OFFICE): Fecal Occult Blood, POC: NEGATIVE

## 2019-05-03 MED ORDER — ESCITALOPRAM OXALATE 20 MG PO TABS: 20.0000 mg | ORAL_TABLET | Freq: Every day | ORAL | 4 refills | Status: DC

## 2019-05-03 MED ORDER — LEVOCETIRIZINE DIHYDROCHLORIDE 5 MG PO TABS: 5.0000 mg | ORAL_TABLET | Freq: Every evening | ORAL | 1 refills | Status: DC

## 2019-05-03 MED ORDER — VALACYCLOVIR HCL 1 G PO TABS: 1000.0000 mg | ORAL_TABLET | Freq: Two times a day (BID) | ORAL | 2 refills | Status: DC

## 2019-05-03 NOTE — Progress Notes (Signed)
Patient ID: Rhonda [name], female   DOB: [birth year], 59 y.o.   MRN: [id number] History of Present Illness: Rhonda [name] is a 59 year old white female, divorced, sp hysterectomy in for a well woman gyn exam and pap.   Current Medications, Allergies, Past Medical History, Past Surgical History, Family History and Social History were reviewed in [employer] record.     Review of Systems: Patient denies any headaches, hearing loss, fatigue, blurred vision, shortness of breath, chest pain, abdominal pain, problems with bowel movements, urination, or intercourse(not active). No joint pain or mood swings. Allergies are bothering her and teeth feel better on antibiotic, sees dentist next week.   Physical Exam:BP 125/74 (BP Location: Left Arm, Patient Position: Sitting, Cuff Size: Large)   Pulse 79   Ht 5\' 7"  (1.702 m)   Wt 214 lb 8 oz (97.3 kg)   BMI 33.60 kg/m  General:  Well developed, well nourished, no acute distress Skin:  Warm and dry Neck:  Midline trachea, normal thyroid, good ROM, no lymphadenopathy Lungs; Clear to auscultation bilaterally Breast:  No dominant palpable mass, retraction, or nipple discharge Cardiovascular: Regular rate and rhythm Abdomen:  Soft, non tender, no hepatosplenomegaly Pelvic:  External genitalia is normal in appearance, has vesicles right labia, HSV culture obtained.   The vagina is pale with loss of moisture and rugae. Urethra has no lesions or masses. The cervix and uterus are absent.  No adnexal masses or tenderness noted.Bladder is non tender, no masses felt. Rectal: Good sphincter tone, no polyps, or hemorrhoids felt.  Hemoccult negative. Extremities/musculoskeletal:  No swelling or varicosities noted, no clubbing or cyanosis Psych:  No mood changes, alert and cooperative,seems happy Fall risk is low Alcohol audit is 1 PHQ 9 score is 3, no SI is on lexapro.  Examination chaperoned by Levy Pupa LPN  Impression and Plan: 1. Vaginal  Pap smear following hysterectomy for malignancy Pap sent   2. Encounter for well woman exam with routine gynecological exam Pap sent Physical in 1 year Get mammogram  Check CBC,CMP,TSH and lipids  3. Screening for colorectal cancer Will rx cologard, form signed   4. Herpes HSV culture sent Will rx valtrex  5. Seasonal allergies Will rx xyzal  6. Anxiety Continue lexapro Meds ordered this encounter  Medications  . valACYclovir (VALTREX) 1000 MG tablet    Sig: Take 1 tablet (1,000 mg total) by mouth 2 (two) times daily.    Dispense:  20 tablet    Refill:  2    Order Specific Question:   Supervising Provider    Answer:   Elonda Husky, LUTHER H [[id number]]  . levocetirizine (XYZAL) 5 MG tablet    Sig: Take 1 tablet (5 mg total) by mouth every evening.    Dispense:  30 tablet    Refill:  1    Order Specific Question:   Supervising Provider    Answer:   Elonda Husky, LUTHER H [[id number]]  . escitalopram (LEXAPRO) 20 MG tablet    Sig: Take 1 tablet (20 mg total) by mouth daily.    Dispense:  90 tablet    Refill:  4    Order Specific Question:   Supervising Provider    Answer:   Elonda Husky, LUTHER H [[id number]]    7. Screening for diabetes mellitus Check A1c  8. Encounter for vitamin deficiency screening Check Vitamin D

## 2019-05-04 LAB — LIPID PANEL
Chol/HDL Ratio: 3.8 ratio (ref 0.0–4.4)
Cholesterol, Total: 207 mg/dL — ABNORMAL HIGH (ref 100–199)
HDL: 55 mg/dL (ref >39)
LDL Chol Calc (NIH): 120 mg/dL — ABNORMAL HIGH (ref 0–99)
Triglycerides: 182 mg/dL — ABNORMAL HIGH (ref 0–149)
VLDL Cholesterol Cal: 32 mg/dL (ref 5–40)

## 2019-05-04 LAB — HEMOGLOBIN A1C
Est. average glucose Bld gHb Est-mCnc: 111 mg/dL
Hgb A1c MFr Bld: 5.5 % (ref 4.8–5.6)

## 2019-05-04 LAB — COMPREHENSIVE METABOLIC PANEL
ALT: 26 IU/L (ref 0–32)
AST: 22 IU/L (ref 0–40)
Albumin/Globulin Ratio: 1.8 (ref 1.2–2.2)
Albumin: 4.6 g/dL (ref 3.8–4.9)
Alkaline Phosphatase: 113 IU/L (ref 39–117)
BUN/Creatinine Ratio: 13 (ref 9–23)
BUN: 11 mg/dL (ref 6–24)
Bilirubin Total: 0.4 mg/dL (ref 0.0–1.2)
CO2: 22 mmol/L (ref 20–29)
Calcium: 9.8 mg/dL (ref 8.7–10.2)
Chloride: 104 mmol/L (ref 96–106)
Creatinine, Ser: 0.84 mg/dL (ref 0.57–1.00)
GFR calc Af Amer: 88 mL/min/{1.73_m2} (ref >59)
GFR calc non Af Amer: 76 mL/min/{1.73_m2} (ref >59)
Globulin, Total: 2.5 g/dL (ref 1.5–4.5)
Glucose: 100 mg/dL — ABNORMAL HIGH (ref 65–99)
Potassium: 4.4 mmol/L (ref 3.5–5.2)
Sodium: 142 mmol/L (ref 134–144)
Total Protein: 7.1 g/dL (ref 6.0–8.5)

## 2019-05-04 LAB — CYTOLOGY - PAP
Comment: NEGATIVE
Diagnosis: NEGATIVE
High risk HPV: NEGATIVE

## 2019-05-04 LAB — VITAMIN D 25 HYDROXY (VIT D DEFICIENCY, FRACTURES): Vit D, 25-Hydroxy: 49.9 ng/mL (ref 30.0–100.0)

## 2019-05-04 LAB — CBC
Hematocrit: 43.1 % (ref 34.0–46.6)
Hemoglobin: 14.4 g/dL (ref 11.1–15.9)
MCH: 27.8 pg (ref 26.6–33.0)
MCHC: 33.4 g/dL (ref 31.5–35.7)
MCV: 83 fL (ref 79–97)
Platelets: 293 10*3/uL (ref 150–450)
RBC: 5.18 x10E6/uL (ref 3.77–5.28)
RDW: 13.9 % (ref 11.7–15.4)
WBC: 4.6 10*3/uL (ref 3.4–10.8)

## 2019-05-04 LAB — TSH: TSH: 2.01 u[IU]/mL (ref 0.450–4.500)

## 2019-05-06 LAB — HERPES SIMPLEX VIRUS CULTURE

## 2019-06-02 ENCOUNTER — Other Ambulatory Visit: Payer: Self-pay | Admitting: Adult Health

## 2019-06-24 ENCOUNTER — Encounter: Payer: Self-pay | Admitting: Emergency Medicine

## 2019-06-24 ENCOUNTER — Ambulatory Visit
Admission: EM | Admit: 2019-06-24 | Discharge: 2019-06-24 | Disposition: A | Discharge status: Home / Self Care | Payer: BLUE CROSS/BLUE SHIELD | Attending: Emergency Medicine | Admitting: Emergency Medicine

## 2019-06-24 ENCOUNTER — Other Ambulatory Visit: Payer: Self-pay

## 2019-06-24 DIAGNOSIS — J208 Acute bronchitis due to other specified organisms: Secondary | ICD-10-CM | POA: Diagnosis not present

## 2019-06-24 MED ORDER — BENZONATATE 100 MG PO CAPS: 100.0000 mg | ORAL_CAPSULE | Freq: Three times a day (TID) | ORAL | 0 refills | Status: DC

## 2019-06-24 MED ORDER — AZITHROMYCIN 250 MG PO TABS: 250.0000 mg | ORAL_TABLET | Freq: Every day | ORAL | 0 refills | Status: DC

## 2019-06-24 MED ORDER — PREDNISONE 10 MG (21) PO TBPK: ORAL_TABLET | ORAL | 0 refills | Status: DC

## 2019-06-24 NOTE — Discharge Instructions (Addendum)
Tessalon prescribed for cough Prednisone for wheezing Azithromycin prescribed for possible bronchitis Rest push fluids Return or follow up with PCP in 24 hours to be reevaluated and to ensure your symptoms are improving Return sooner or go to the ED if you have any new or worsening symptoms such as difficulty breathing, shortness of breath, chest pain, nausea, vomiting, throat tightness or swelling, tongue swelling or tingling, worsening lip or facial swelling, abdominal pain, changes in bowel or bladder habits, no improvement despite medications, etc..Marland Kitchen

## 2019-06-24 NOTE — ED Provider Notes (Addendum)
[place]   [id number] 06/24/19 Arrival Time: 1806  Cc: Cough/SOB  SUBJECTIVE:  Rhonda [name] is a 59 y.o. female who presents with shortness of breath, postnasal drip, cough for the past few days.  Has seen PCP and was prescribed levocetirizine.  Denies precipitating event, exposure, known allergy or trigger. Denies changes in medication or starting a new medication.   Symptoms are made worse at nighttime.  Denies previous symptoms in the past.   Denies fever, chills, nausea, vomiting, erythema, redness, swollen glands, oral manifestations such as throat swelling/ tingling, mouth swelling/ tingling, tongue swelling/tingling, dyspnea, chest pain, abdominal pain, changes in bowel or bladder function.     ROS: As per HPI.  All other pertinent ROS negative.     Past Medical History:  Diagnosis Date  . Allergy    seasonal  . Anxiety   . Cancer (HCC)    cervical cancer  . Closed olecranon fracture    left  . Headache   . Pneumonia   . PONV (postoperative nausea and vomiting)   . Radial neck fracture   . Vitamin D deficiency 08/28/2016   Past Surgical History:  Procedure Laterality Date  . ABDOMINAL HYSTERECTOMY     partial  . CHOLECYSTECTOMY    . ORIF ELBOW FRACTURE Left 11/18/2014   Procedure: OPEN REDUCTION INTERNAL FIXATION (ORIF) LEFT OLECRANON, OPEN REDUCTION INTERNAL FIXATION LEFT RADIAL NECK FRACTURE;  Surgeon: Marybelle Killings, MD;  Location: [facility];  Service: Orthopedics;  Laterality: Left;   No Known Allergies No current facility-administered medications on file prior to encounter.   Current Outpatient Medications on File Prior to Encounter  Medication Sig Dispense Refill  . Cholecalciferol 5000 units capsule Take 1 capsule (5,000 Units total) by mouth daily.    Marland Kitchen escitalopram (LEXAPRO) 20 MG tablet Take 1 tablet (20 mg total) by mouth daily. 90 tablet 4  . levocetirizine (XYZAL) 5 MG tablet TAKE ONE TABLET BY MOUTH IN THE EVENING. 30 tablet 6  . Multiple  Vitamin (MULTIVITAMIN) tablet Take 1 tablet by mouth daily.    Marland Kitchen amoxicillin (AMOXIL) 500 MG capsule Take 1 capsule (500 mg total) by mouth 3 (three) times daily. 30 capsule 0  . diphenhydrAMINE (BENADRYL) 25 MG tablet Take 25 mg by mouth at bedtime.    . valACYclovir (VALTREX) 1000 MG tablet Take 1 tablet (1,000 mg total) by mouth 2 (two) times daily. 20 tablet 2    Social History   Socioeconomic History  . Marital status: Single    Spouse name: Not on file  . Number of children: Not on file  . Years of education: Not on file  . Highest education level: Not on file  Occupational History  . Not on file  Tobacco Use  . Smoking status: Never Smoker  . Smokeless tobacco: Never Used  Substance and Sexual Activity  . Alcohol use: Yes    Comment: wine once a month  . Drug use: No  . Sexual activity: Not Currently    Birth control/protection: Surgical    Comment: hyst  Other Topics Concern  . Not on file  Social History Narrative  . Not on file   Social Determinants of Health   Financial Resource Strain: Low Risk   . Difficulty of Paying Living Expenses: Not hard at all  Food Insecurity: No Food Insecurity  . Worried About Charity fundraiser in the Last Year: Never true  . Ran Out of Food in the Last Year: Never true  Transportation Needs: No Transportation Needs  . Lack of Transportation (Medical): No  . Lack of Transportation (Non-Medical): No  Physical Activity: Insufficiently Active  . Days of Exercise per Week: 2 days  . Minutes of Exercise per Session: 30 min  Stress: No Stress Concern Present  . Feeling of Stress : Only a little  Social Connections: Slightly Isolated  . Frequency of Communication with Friends and Family: More than three times a week  . Frequency of Social Gatherings with Friends and Family: Twice a week  . Attends Religious Services: 1 to 4 times per year  . Active Member of Clubs or Organizations: Yes  . Attends Archivist Meetings: More  than 4 times per year  . Marital Status: Divorced  Human resources officer Violence: Not At Risk  . Fear of Current or Ex-Partner: No  . Emotionally Abused: No  . Physically Abused: No  . Sexually Abused: No   Family History  Problem Relation Age of Onset  . Other Mother        sepsis  . Heart attack Father   . Hypertension Sister   . Hypertension Brother   . Epilepsy Daughter   . ADD / ADHD Daughter   . Hypertension Brother      OBJECTIVE:  Vitals:   06/24/19 1820 06/24/19 1821  BP: 132/77   Pulse: 69   Resp: 18   Temp: 98.7 F (37.1 C)   TempSrc: Oral   SpO2: 94%   Weight:  205 lb (93 kg)  Height:  5\' 7"  (1.702 m)     General appearance: Alert, speaking in full sentences without difficulty HEENT:NCAT; no obvious facial swelling; Ears: EACs clear, TMs pearly gray; Eyes: PERRL.  EOM grossly intact. Nose: nares patent without rhinorrhea; Throat: tonsils nonerythematous or enlarged, uvula midline Neck: supple without LAD Lungs: Wheezing present on bilateral lungs; normal respiratory effort; no labored respirations Heart: regular rate and rhythm.  Radial pulses 2+ symmetrical bilaterally; cap refill < 2 seconds Abdomen: soft, nondistended, normal active bowel sounds; nontender to palpation; no guarding  Skin: warm and dry Psychological: alert and cooperative; normal mood and affect  ASSESSMENT & PLAN:  1. Acute bronchitis due to other specified organisms     Meds ordered this encounter  Medications  . azithromycin (ZITHROMAX) 250 MG tablet    Sig: Take 1 tablet (250 mg total) by mouth daily. Take first 2 tablets together, then 1 every day until finished.    Dispense:  6 tablet    Refill:  0  . benzonatate (TESSALON) 100 MG capsule    Sig: Take 1 capsule (100 mg total) by mouth every 8 (eight) hours.    Dispense:  30 capsule    Refill:  0  . predniSONE (STERAPRED UNI-PAK 21 TAB) 10 MG (21) TBPK tablet    Sig: Take 6 tabs by mouth daily  for 2 days, then 5 tabs for 2  days, then 4 tabs for 2 days, then 3 tabs for 2 days, 2 tabs for 2 days, then 1 tab by mouth daily for 2 days    Dispense:  42 tablet    Refill:  0    No orders of the defined types were placed in this encounter. Patient is stable at discharge.  Her symptoms are more likely Covid related or bronchitis in nature.  Patient was advised to have Covid test done for rule out.  She declined.  We will treat her for possible bronchitis  Discharge instructions  Tessalon prescribed for cough Prednisone for wheezing Azithromycin prescribed for possible bronchitis Rest push fluids Return or follow up with PCP in 24 hours to be reevaluated and to ensure your symptoms are improving Return sooner or go to the ED if you have any new or worsening symptoms such as difficulty breathing, shortness of breath, chest pain, nausea, vomiting, throat tightness or swelling, tongue swelling or tingling, worsening lip or facial swelling, abdominal pain, changes in bowel or bladder habits, no improvement despite medications, etc...   Reviewed expectations re: course of current medical issues. Questions answered. Outlined signs and symptoms indicating need for more acute intervention. Patient verbalized understanding. After Visit Summary given.          Emerson Monte, [place] 06/24/19 [phone]

## 2019-06-24 NOTE — ED Triage Notes (Addendum)
Pt reports she has been having a lot of trouble with her allergies lately.  For the past month states she gets short of breath with ambulation and feels like choking sometimes when she lays down.  Was given allergy med by pcp that has helped some, but the pt believes she has an URI.  Pt states she has these same symptoms every year around this time.

## 2019-09-11 ENCOUNTER — Encounter: Payer: Self-pay | Admitting: Emergency Medicine

## 2019-09-11 ENCOUNTER — Ambulatory Visit
Admission: EM | Admit: 2019-09-11 | Discharge: 2019-09-11 | Disposition: A | Discharge status: Home / Self Care | Payer: BLUE CROSS/BLUE SHIELD | Attending: Emergency Medicine | Admitting: Emergency Medicine

## 2019-09-11 ENCOUNTER — Other Ambulatory Visit: Payer: Self-pay

## 2019-09-11 DIAGNOSIS — J069 Acute upper respiratory infection, unspecified: Secondary | ICD-10-CM

## 2019-09-11 DIAGNOSIS — Z1152 Encounter for screening for COVID-19: Secondary | ICD-10-CM

## 2019-09-11 MED ORDER — BENZONATATE 100 MG PO CAPS: 100.0000 mg | ORAL_CAPSULE | Freq: Three times a day (TID) | ORAL | 0 refills | Status: DC

## 2019-09-11 MED ORDER — CETIRIZINE HCL 10 MG PO TABS: 10.0000 mg | ORAL_TABLET | Freq: Every day | ORAL | 0 refills | Status: DC

## 2019-09-11 MED ORDER — FLUTICASONE PROPIONATE 50 MCG/ACT NA SUSP: 1.0000 | Freq: Every day | NASAL | 0 refills | Status: DC

## 2019-09-11 MED ORDER — PREDNISONE 10 MG (21) PO TBPK: ORAL_TABLET | ORAL | 0 refills | Status: DC

## 2019-09-11 NOTE — Discharge Instructions (Addendum)
COVID testing ordered.  It will take between 2-7 days for test results.  Someone will contact you regarding abnormal results.    In the meantime: You should remain isolated in your home for 10 days from symptom onset AND greater than 24 hours after symptoms resolution (absence of fever without the use of fever-reducing medication and improvement in respiratory symptoms), whichever is longer Get plenty of rest and push fluids Tessalon Perles prescribed for cough Zyrtec for nasal congestion, runny nose, and/or sore throat Flonase for nasal congestion and runny nose Prednisone taper was prescribed Use medications daily for symptom relief Use OTC medications like ibuprofen or tylenol as needed fever or pain Call or go to the ED if you have any new or worsening symptoms such as fever, worsening cough, shortness of breath, chest tightness, chest pain, turning blue, changes in mental status, etc..Marland Kitchen

## 2019-09-11 NOTE — ED Provider Notes (Signed)
[place]   [id number] 09/11/19 Arrival Time: 7829   CC: COVID symptoms  SUBJECTIVE: History from: patient.  LUAN URBANI is a 59 y.o. female who presents to the urgent care for complaint of congestion, cough and postnasal drip for the past 3 days.  Denies sick exposure to COVID, flu or strep.  Denies recent travel.  Has tried OTC medication without relief.  Denies any aggravating factors.  Denies previous symptoms in the past.   Denies fever, chills, fatigue, sinus pain, rhinorrhea, sore throat, SOB, wheezing, chest pain, nausea, changes in bowel or bladder habits.    ROS: As per HPI.  All other pertinent ROS negative.      Past Medical History:  Diagnosis Date  . Allergy    seasonal  . Anxiety   . Cancer (HCC)    cervical cancer  . Closed olecranon fracture    left  . Headache   . Pneumonia   . PONV (postoperative nausea and vomiting)   . Radial neck fracture   . Vitamin D deficiency 08/28/2016   Past Surgical History:  Procedure Laterality Date  . ABDOMINAL HYSTERECTOMY     partial  . CHOLECYSTECTOMY    . ORIF ELBOW FRACTURE Left 11/18/2014   Procedure: OPEN REDUCTION INTERNAL FIXATION (ORIF) LEFT OLECRANON, OPEN REDUCTION INTERNAL FIXATION LEFT RADIAL NECK FRACTURE;  Surgeon: Marybelle Killings, MD;  Location: [facility];  Service: Orthopedics;  Laterality: Left;   No Known Allergies No current facility-administered medications on file prior to encounter.   Current Outpatient Medications on File Prior to Encounter  Medication Sig Dispense Refill  . amoxicillin (AMOXIL) 500 MG capsule Take 1 capsule (500 mg total) by mouth 3 (three) times daily. 30 capsule 0  . azithromycin (ZITHROMAX) 250 MG tablet Take 1 tablet (250 mg total) by mouth daily. Take first 2 tablets together, then 1 every day until finished. 6 tablet 0  . Cholecalciferol 5000 units capsule Take 1 capsule (5,000 Units total) by mouth daily.    . diphenhydrAMINE (BENADRYL) 25 MG tablet Take 25 mg by  mouth at bedtime.    Marland Kitchen escitalopram (LEXAPRO) 20 MG tablet Take 1 tablet (20 mg total) by mouth daily. 90 tablet 4  . levocetirizine (XYZAL) 5 MG tablet TAKE ONE TABLET BY MOUTH IN THE EVENING. 30 tablet 6  . Multiple Vitamin (MULTIVITAMIN) tablet Take 1 tablet by mouth daily.    . valACYclovir (VALTREX) 1000 MG tablet Take 1 tablet (1,000 mg total) by mouth 2 (two) times daily. 20 tablet 2   Social History   Socioeconomic History  . Marital status: Single    Spouse name: Not on file  . Number of children: Not on file  . Years of education: Not on file  . Highest education level: Not on file  Occupational History  . Not on file  Tobacco Use  . Smoking status: Never Smoker  . Smokeless tobacco: Never Used  Vaping Use  . Vaping Use: Never used  Substance and Sexual Activity  . Alcohol use: Yes    Comment: wine once a month  . Drug use: No  . Sexual activity: Not Currently    Birth control/protection: Surgical    Comment: hyst  Other Topics Concern  . Not on file  Social History Narrative  . Not on file   Social Determinants of Health   Financial Resource Strain: Low Risk   . Difficulty of Paying Living Expenses: Not hard at all  Food Insecurity: No Food  Insecurity  . Worried About Charity fundraiser in the Last Year: Never true  . Ran Out of Food in the Last Year: Never true  Transportation Needs: No Transportation Needs  . Lack of Transportation (Medical): No  . Lack of Transportation (Non-Medical): No  Physical Activity: Insufficiently Active  . Days of Exercise per Week: 2 days  . Minutes of Exercise per Session: 30 min  Stress: No Stress Concern Present  . Feeling of Stress : Only a little  Social Connections: Moderately Integrated  . Frequency of Communication with Friends and Family: More than three times a week  . Frequency of Social Gatherings with Friends and Family: Twice a week  . Attends Religious Services: 1 to 4 times per year  . Active Member of Clubs  or Organizations: Yes  . Attends Archivist Meetings: More than 4 times per year  . Marital Status: Divorced  Human resources officer Violence: Not At Risk  . Fear of Current or Ex-Partner: No  . Emotionally Abused: No  . Physically Abused: No  . Sexually Abused: No   Family History  Problem Relation Age of Onset  . Other Mother        sepsis  . Heart attack Father   . Hypertension Sister   . Hypertension Brother   . Epilepsy Daughter   . ADD / ADHD Daughter   . Hypertension Brother     OBJECTIVE:  Vitals:   09/11/19 1335 09/11/19 1349  BP: 90/60   Pulse: 82   Resp: 16   Temp: 99.2 F (37.3 C)   TempSrc: Oral   SpO2: 97%   Weight:  205 lb 0.4 oz (93 kg)  Height:  5\' 7"  (1.702 m)     General appearance: alert; appears fatigued, but nontoxic; speaking in full sentences and tolerating own secretions HEENT: NCAT; Ears: EACs clear, TMs pearly gray; Eyes: PERRL.  EOM grossly intact. Sinuses: nontender; Nose: nares patent without rhinorrhea, Throat: oropharynx clear, tonsils non erythematous or enlarged, uvula midline  Neck: supple without LAD Lungs: unlabored respirations, wheezing present, symmetrical air entry; cough: mild; no respiratory distress; CTAB Heart: regular rate and rhythm.  Radial pulses 2+ symmetrical bilaterally Skin: warm and dry Psychological: alert and cooperative; normal mood and affect  LABS:  No results found for this or any previous visit (from the past 24 hour(s)).   ASSESSMENT & PLAN:  1. URI with cough and congestion   2. Encounter for screening for COVID-19     Meds ordered this encounter  Medications  . predniSONE (STERAPRED UNI-PAK 21 TAB) 10 MG (21) TBPK tablet    Sig: Take 6 tabs by mouth daily  for 1 days, then 5 tabs for 1 days, then 4 tabs for 1 days, then 3 tabs for 1 days, 2 tabs for 1 days, then 1 tab by mouth daily for 1 days    Dispense:  21 tablet    Refill:  0  . fluticasone (FLONASE) 50 MCG/ACT nasal spray    Sig:  Place 1 spray into both nostrils daily for 14 days.    Dispense:  16 g    Refill:  0  . cetirizine (ZYRTEC ALLERGY) 10 MG tablet    Sig: Take 1 tablet (10 mg total) by mouth daily.    Dispense:  30 tablet    Refill:  0  . benzonatate (TESSALON) 100 MG capsule    Sig: Take 1 capsule (100 mg total) by mouth every 8 (eight) hours.  Dispense:  30 capsule    Refill:  0    Discharge Instructions.    COVID testing ordered.  It will take between 2-7 days for test results.  Someone will contact you regarding abnormal results.    In the meantime: You should remain isolated in your home for 10 days from symptom onset AND greater than 24 hours after symptoms resolution (absence of fever without the use of fever-reducing medication and improvement in respiratory symptoms), whichever is longer Get plenty of rest and push fluids Tessalon Perles prescribed for cough Zyrtec for nasal congestion, runny nose, and/or sore throat Flonase for nasal congestion and runny nose Prednisone taper was prescribed Use medications daily for symptom relief Use OTC medications like ibuprofen or tylenol as needed fever or pain Call or go to the ED if you have any new or worsening symptoms such as fever, worsening cough, shortness of breath, chest tightness, chest pain, turning blue, changes in mental status, etc...   Reviewed expectations re: course of current medical issues. Questions answered. Outlined signs and symptoms indicating need for more acute intervention. Patient verbalized understanding. After Visit Summary given.      \Note: This document was prepared using Systems analyst and may include unintentional dictation errors.     Emerson Monte, FNP 09/11/19 1427

## 2019-09-11 NOTE — ED Triage Notes (Signed)
Pt states that her allergies are acting up . Reports headache behind her eyes and dry cough x 3 days

## 2019-09-12 LAB — NOVEL CORONAVIRUS, NAA: SARS-CoV-2, NAA: DETECTED — AB

## 2019-09-12 LAB — SARS-COV-2, NAA 2 DAY TAT

## 2019-09-18 ENCOUNTER — Telehealth: Payer: BLUE CROSS/BLUE SHIELD | Admitting: Physician Assistant

## 2019-09-18 DIAGNOSIS — U071 COVID-19: Secondary | ICD-10-CM | POA: Diagnosis not present

## 2019-09-18 DIAGNOSIS — R05 Cough: Secondary | ICD-10-CM

## 2019-09-18 DIAGNOSIS — R059 Cough, unspecified: Secondary | ICD-10-CM

## 2019-09-18 DIAGNOSIS — R0602 Shortness of breath: Secondary | ICD-10-CM | POA: Diagnosis not present

## 2019-09-18 MED ORDER — PROMETHAZINE-DM 6.25-15 MG/5ML PO SYRP
5.0000 mL | ORAL_SOLUTION | Freq: Four times a day (QID) | ORAL | 0 refills | Status: DC | PRN
Start: 2019-09-18 — End: 2021-02-26

## 2019-09-18 MED ORDER — ALBUTEROL SULFATE HFA 108 (90 BASE) MCG/ACT IN AERS: 2.0000 | INHALATION_SPRAY | RESPIRATORY_TRACT | 0 refills | Status: DC | PRN

## 2019-09-18 MED ORDER — BENZONATATE 100 MG PO CAPS: 100.0000 mg | ORAL_CAPSULE | Freq: Three times a day (TID) | ORAL | 0 refills | Status: DC | PRN

## 2019-09-18 NOTE — Progress Notes (Signed)
We are sorry you are not feeling well. We are here to help!  You have tested positive for COVID-19, meaning that you were infected with the novel coronavirus and could give the germ to others.    You have been enrolled in [facility] for COVID-19. Daily you will receive a questionnaire within the [facility] website. Our COVID-19 response team will be monitoring your responses daily.  Please continue isolation at home, for at least 10 days since the start of your symptoms and until you have had 24 hours with no fever (without taking a fever reducer) and with improving of symptoms.  Please continue good preventive care measures, including:  frequent hand-washing, avoid touching your face, cover coughs/sneezes, stay out of crowds and keep a 6 foot distance from others.  Follow up with your provider or go to the nearest hospital ED for re-assessment if fever/cough/breathlessness return.  The following symptoms may appear 2-14 days after exposure: . Fever . Cough . Shortness of breath or difficulty breathing . Chills . Repeated shaking with chills . Muscle pain . Headache . Sore throat . New loss of taste or smell . Fatigue . Congestion or runny nose . Nausea or vomiting . Diarrhea  Go to the nearest hospital ED for assessment if fever/cough/breathlessness are severe or illness seems like a threat to life.  It is vitally important that if you feel that you have an infection such as this virus or any other virus that you stay home and away from places where you may spread it to others.  You should avoid contact with people age 82 and older.   You can use medication such as A prescription cough medication called Tessalon Perles 100 mg. You may take 1-2 capsules every 8 hours as needed for cough, A prescription inhaler called Albuterol MDI 90 mcg /actuation 2 puffs every 4 hours as needed for shortness of breath, wheezing, cough and A prescription cough medication called Phenergan DM 6.25  mg/15 mg. You make take one teaspoon / 5 ml every 4-6 hours as needed for cough  You may also take acetaminophen (Tylenol) as needed for fever.  Reduce your risk of any infection by using the same precautions used for avoiding the common cold or flu:  Marland Kitchen Wash your hands often with soap and warm water for at least 20 seconds.  If soap and water are not readily available, use an alcohol-based hand sanitizer with at least 60% alcohol.  . If coughing or sneezing, cover your mouth and nose by coughing or sneezing into the elbow areas of your shirt or coat, into a tissue or into your sleeve (not your hands). . Avoid shaking hands with others and consider head nods or verbal greetings only. . Avoid touching your eyes, nose, or mouth with unwashed hands.  . Avoid close contact with people who are sick. . Avoid places or events with large numbers of people in one location, like concerts or sporting events. . Carefully consider travel plans you have or are making. . If you are planning any travel outside or inside the [place], visit the CDC's Travelers' Health webpage for the latest health notices. . If you have some symptoms but not all symptoms, continue to monitor at home and seek medical attention if your symptoms worsen. . If you are having a medical emergency, call 911.  HOME CARE . Only take medications as instructed by your medical team. . Drink plenty of fluids and get plenty of rest. . A  steam or ultrasonic humidifier can help if you have congestion.   GET HELP RIGHT AWAY IF YOU HAVE EMERGENCY WARNING SIGNS** FOR COVID-19. If you or someone is showing any of these signs seek emergency medical care immediately. Call 911 or proceed to your closest emergency facility if: . You develop worsening high fever. . Trouble breathing . Bluish lips or face . Persistent pain or pressure in the chest . New confusion . Inability to wake or stay awake . You cough up blood. . Your symptoms become more  severe  **This list is not all possible symptoms. Contact your medical provider for any symptoms that are sever or concerning to you.  MAKE SURE YOU   Understand these instructions.  Will watch your condition.  Will get help right away if you are not doing well or get worse.  Your e-visit answers were reviewed by a board certified advanced clinical practitioner to complete your personal care plan.  Depending on the condition, your plan could have included both over the counter or prescription medications.  If there is a problem please reply once you have received a response from your provider.  Your safety is important to [place].  If you have drug allergies check your prescription carefully.    You can use MyChart to ask questions about today's visit, request a non-urgent call back, or ask for a work or school excuse for 24 hours related to this e-Visit. If it has been greater than 24 hours you will need to follow up with your provider, or enter a new e-Visit to address those concerns. You will get an e-mail in the next two days asking about your experience.  I hope that your e-visit has been valuable and will speed your recovery. Thank you for using e-visits.   Greater than 5 minutes, yet less than 10 minutes of time have been spent researching, coordinating and implementing care for this patient today.

## 2020-01-19 ENCOUNTER — Other Ambulatory Visit: Payer: Self-pay | Admitting: Adult Health

## 2020-05-23 ENCOUNTER — Other Ambulatory Visit: Payer: Self-pay | Admitting: Adult Health

## 2020-09-06 ENCOUNTER — Other Ambulatory Visit: Payer: Self-pay | Admitting: Adult Health

## 2020-12-11 ENCOUNTER — Other Ambulatory Visit: Payer: Self-pay | Admitting: Adult Health

## 2021-02-08 ENCOUNTER — Other Ambulatory Visit: Payer: Self-pay | Admitting: Adult Health

## 2021-02-26 ENCOUNTER — Ambulatory Visit
Admission: EM | Admit: 2021-02-26 | Discharge: 2021-02-26 | Disposition: A | Discharge status: Home / Self Care | Payer: No Typology Code available for payment source | Attending: Urgent Care | Admitting: Urgent Care

## 2021-02-26 ENCOUNTER — Other Ambulatory Visit: Payer: Self-pay

## 2021-02-26 DIAGNOSIS — J018 Other acute sinusitis: Secondary | ICD-10-CM | POA: Diagnosis not present

## 2021-02-26 DIAGNOSIS — R0602 Shortness of breath: Secondary | ICD-10-CM | POA: Diagnosis not present

## 2021-02-26 DIAGNOSIS — R059 Cough, unspecified: Secondary | ICD-10-CM | POA: Diagnosis not present

## 2021-02-26 DIAGNOSIS — J4530 Mild persistent asthma, uncomplicated: Secondary | ICD-10-CM

## 2021-02-26 DIAGNOSIS — U071 COVID-19: Secondary | ICD-10-CM

## 2021-02-26 MED ORDER — BENZONATATE 100 MG PO CAPS: 100.0000 mg | ORAL_CAPSULE | Freq: Three times a day (TID) | ORAL | 0 refills | Status: DC | PRN

## 2021-02-26 MED ORDER — LEVOCETIRIZINE DIHYDROCHLORIDE 5 MG PO TABS: 5.0000 mg | ORAL_TABLET | Freq: Every evening | ORAL | 0 refills | Status: DC

## 2021-02-26 MED ORDER — ALBUTEROL SULFATE HFA 108 (90 BASE) MCG/ACT IN AERS: 2.0000 | INHALATION_SPRAY | RESPIRATORY_TRACT | 0 refills | Status: DC | PRN

## 2021-02-26 MED ORDER — PROMETHAZINE-DM 6.25-15 MG/5ML PO SYRP: 5.0000 mL | ORAL_SOLUTION | Freq: Every evening | ORAL | 0 refills | Status: DC | PRN

## 2021-02-26 MED ORDER — AMOXICILLIN-POT CLAVULANATE 875-125 MG PO TABS: 1.0000 | ORAL_TABLET | Freq: Two times a day (BID) | ORAL | 0 refills | Status: DC

## 2021-02-26 MED ORDER — PREDNISONE 50 MG PO TABS: 50.0000 mg | ORAL_TABLET | Freq: Every day | ORAL | 0 refills | Status: DC

## 2021-02-26 NOTE — ED Provider Notes (Signed)
[place]   MRN: [id number] DOB: 1960-12-06  Subjective:   Rhonda [name] is a 61 y.o. female presenting for 2-week history of persistent and worsening coughing, hoarseness, bilateral ear pain and sinus pressure.  Patient feels like her cough is getting worse, feels congestion in her chest and also throat pain and back pain with her cough.  Has started to lose her voice from coughing.  She does have a history of allergic rhinitis.  No history of smoking.  She would like a refill of her asthma inhaler.  No current facility-administered medications for this encounter.  Current Outpatient Medications:    albuterol (VENTOLIN HFA) 108 (90 Base) MCG/ACT inhaler, Inhale 2 puffs into the lungs every 4 (four) hours as needed for wheezing or shortness of breath (cough, shortness of breath or wheezing.)., Disp: 6.7 g, Rfl: 0   escitalopram (LEXAPRO) 20 MG tablet, TAKE ONE TABLET BY MOUTH DAILY., Disp: 30 tablet, Rfl: 1   fluticasone (FLONASE) 50 MCG/ACT nasal spray, Place 1 spray into both nostrils daily for 14 days., Disp: 16 g, Rfl: 0   levocetirizine (XYZAL) 5 MG tablet, TAKE ONE TABLET BY MOUTH IN THE EVENING., Disp: 30 tablet, Rfl: 6   Multiple Vitamin (MULTIVITAMIN) tablet, Take 1 tablet by mouth daily., Disp: , Rfl:    No Known Allergies  Past Medical History:  Diagnosis Date   Allergy    seasonal   Anxiety    Cancer ([place])    cervical cancer   Closed olecranon fracture    left   Headache    Pneumonia    PONV (postoperative nausea and vomiting)    Radial neck fracture    Vitamin D deficiency 08/28/2016     Past Surgical History:  Procedure Laterality Date   ABDOMINAL HYSTERECTOMY     partial   CHOLECYSTECTOMY     ORIF ELBOW FRACTURE Left 11/18/2014   Procedure: OPEN REDUCTION INTERNAL FIXATION (ORIF) LEFT OLECRANON, OPEN REDUCTION INTERNAL FIXATION LEFT RADIAL NECK FRACTURE;  Surgeon: Marybelle Killings, MD;  Location: [place] [facility];  Service: Orthopedics;  Laterality: Left;     Family History  Problem Relation Age of Onset   Other Mother        sepsis   Heart attack Father    Hypertension Sister    Hypertension Brother    Epilepsy Daughter    ADD / ADHD Daughter    Hypertension Brother     Social History   Tobacco Use   Smoking status: Never   Smokeless tobacco: Never  Vaping Use   Vaping Use: Never used  Substance Use Topics   Alcohol use: Yes    Comment: wine once a month   Drug use: No    ROS   Objective:   Vitals: BP 115/76 (BP Location: Right Arm)    Pulse 91    Temp 98.3 F (36.8 C) (Oral)    Resp 18    SpO2 94%   Physical Exam Constitutional:      General: She is not in acute distress.    Appearance: Normal appearance. She is well-developed and normal weight. She is not ill-appearing, toxic-appearing or diaphoretic.  HENT:     Head: Normocephalic and atraumatic.     Right Ear: Tympanic membrane, ear canal and external ear normal. No drainage or tenderness. No middle ear effusion. There is no impacted cerumen. Tympanic membrane is not erythematous.     Left Ear: Tympanic membrane, ear canal and external ear normal. No drainage or  tenderness.  No middle ear effusion. There is no impacted cerumen. Tympanic membrane is not erythematous.     Nose: No congestion or rhinorrhea.     Mouth/Throat:     Mouth: Mucous membranes are moist. No oral lesions.     Pharynx: No pharyngeal swelling, oropharyngeal exudate, posterior oropharyngeal erythema or uvula swelling.     Tonsils: No tonsillar exudate or tonsillar abscesses.  Eyes:     General: No scleral icterus.       Right eye: No discharge.        Left eye: No discharge.     Extraocular Movements: Extraocular movements intact.     Right eye: Normal extraocular motion.     Left eye: Normal extraocular motion.     Conjunctiva/sclera: Conjunctivae normal.  Cardiovascular:     Rate and Rhythm: Normal rate.  Pulmonary:     Effort: Pulmonary effort is normal.  Musculoskeletal:      Cervical back: Normal range of motion and neck supple.  Lymphadenopathy:     Cervical: No cervical adenopathy.  Skin:    General: Skin is warm and dry.  Neurological:     General: No focal deficit present.     Mental Status: She is alert and oriented to person, place, and time.  Psychiatric:        Mood and Affect: Mood normal.        Behavior: Behavior normal.    Assessment and Plan :   PDMP not reviewed this encounter.  1. Acute non-recurrent sinusitis of other sinus   2. Mild persistent asthma without complication   3. Shortness of breath   4. Cough   5. Lab test positive for detection of COVID-19 virus     Deferred imaging given clear cardiopulmonary exam, hemodynamically stable vital signs. Will start empiric treatment for sinusitis with Augmentin.  Recommended supportive care otherwise including the use of oral antihistamine.  In the context of her asthma, recommended a prednisone course.  Use supportive care otherwise.  Given timeline of her illness, deferred testing.  Counseled patient on potential for adverse effects with medications prescribed/recommended today, ER and return-to-clinic precautions discussed, patient verbalized understanding.    Jaynee Eagles, PA-C 02/26/21 1455

## 2021-02-26 NOTE — ED Triage Notes (Signed)
Pt reports cough, back pain when coughing, bilateral  ear pain and sinus pressure x 2 weeks. Pt reports she lost her voice yesterday.

## 2021-03-15 ENCOUNTER — Other Ambulatory Visit: Payer: Self-pay | Admitting: Adult Health

## 2021-04-18 ENCOUNTER — Other Ambulatory Visit: Payer: Self-pay | Admitting: Adult Health

## 2021-07-05 ENCOUNTER — Ambulatory Visit
Admission: EM | Admit: 2021-07-05 | Discharge: 2021-07-05 | Disposition: A | Discharge status: Home / Self Care | Payer: No Typology Code available for payment source | Attending: Family Medicine | Admitting: Family Medicine

## 2021-07-05 ENCOUNTER — Encounter: Payer: Self-pay | Admitting: Emergency Medicine

## 2021-07-05 ENCOUNTER — Other Ambulatory Visit: Payer: Self-pay

## 2021-07-05 DIAGNOSIS — H00011 Hordeolum externum right upper eyelid: Secondary | ICD-10-CM | POA: Diagnosis not present

## 2021-07-05 DIAGNOSIS — J3089 Other allergic rhinitis: Secondary | ICD-10-CM

## 2021-07-05 DIAGNOSIS — J309 Allergic rhinitis, unspecified: Secondary | ICD-10-CM

## 2021-07-05 MED ORDER — FLUTICASONE PROPIONATE 50 MCG/ACT NA SUSP: 1.0000 | Freq: Two times a day (BID) | NASAL | 2 refills | Status: AC

## 2021-07-05 MED ORDER — PREDNISONE 20 MG PO TABS: 40.0000 mg | ORAL_TABLET | Freq: Every day | ORAL | 0 refills | Status: DC

## 2021-07-05 MED ORDER — ERYTHROMYCIN 5 MG/GM OP OINT: TOPICAL_OINTMENT | OPHTHALMIC | 0 refills | Status: DC

## 2021-07-05 MED ORDER — LEVOCETIRIZINE DIHYDROCHLORIDE 5 MG PO TABS: 5.0000 mg | ORAL_TABLET | Freq: Every day | ORAL | 2 refills | Status: AC

## 2021-07-05 NOTE — ED Provider Notes (Signed)
[facility]    CSN: [id number] Arrival date & time: 07/05/21  1129      History   Chief Complaint Chief Complaint  Patient presents with   Cough    HPI Rhonda [name] is a 61 y.o. female.   Patient presenting today with about a week of sinus pressure, sinus drainage, cough, bloody sputum when blowing nose and now right inner eye irritation, redness, drainage.  Denies fever, chills, body aches, chest pain, shortness of breath, abdominal pain, nausea vomiting or diarrhea.  So far tried some nasal lavage with minimal relief.  Takes Benadryl here and there.  Known history of seasonal allergies not on anything preventative.  No known sick contacts recently.    Past Medical History:  Diagnosis Date   Allergy    seasonal   Anxiety    Cancer ([place])    cervical cancer   Closed olecranon fracture    left   Headache    Pneumonia    PONV (postoperative nausea and vomiting)    Radial neck fracture    Vitamin D deficiency 08/28/2016    Patient Active Problem List   Diagnosis Date Noted   Screening for colorectal cancer 05/03/2019   Seasonal allergies 05/03/2019   Herpes 05/03/2019   Vitamin D deficiency 08/28/2016   Constipation 08/23/2016   Weight gain 08/23/2016   Encounter for well woman exam with routine gynecological exam 08/23/2016   Vaginal Pap smear following hysterectomy for malignancy 08/23/2016   Elbow fracture, left 11/18/2014   Anxiety 04/28/2014    Past Surgical History:  Procedure Laterality Date   ABDOMINAL HYSTERECTOMY     partial   CHOLECYSTECTOMY     ORIF ELBOW FRACTURE Left 11/18/2014   Procedure: OPEN REDUCTION INTERNAL FIXATION (ORIF) LEFT OLECRANON, OPEN REDUCTION INTERNAL FIXATION LEFT RADIAL NECK FRACTURE;  Surgeon: Marybelle Killings, MD;  Location: [place];  Service: Orthopedics;  Laterality: Left;    OB History     Gravida  1   Para  1   Term  1   Preterm      AB      Living  1      SAB      IAB      Ectopic       Multiple      Live Births  1            Home Medications    Prior to Admission medications   Medication Sig Start Date End Date Taking? Authorizing Provider  erythromycin ophthalmic ointment Place a 1/2 inch ribbon of ointment into the right lower eyelid BID. 07/05/21  Yes Volney American, PA-C  predniSONE (DELTASONE) 20 MG tablet Take 2 tablets (40 mg total) by mouth daily with breakfast. 07/05/21  Yes Volney American, PA-C  albuterol (VENTOLIN HFA) 108 (90 Base) MCG/ACT inhaler Inhale 2 puffs into the lungs every 4 (four) hours as needed for wheezing or shortness of breath (cough, shortness of breath or wheezing.). 02/26/21   Jaynee Eagles, PA-C  amoxicillin-clavulanate (AUGMENTIN) 875-125 MG tablet Take 1 tablet by mouth 2 (two) times daily. 02/26/21   Jaynee Eagles, PA-C  benzonatate (TESSALON) 100 MG capsule Take 1-2 capsules (100-200 mg total) by mouth 3 (three) times daily as needed for cough. 02/26/21   Jaynee Eagles, PA-C  escitalopram (LEXAPRO) 20 MG tablet TAKE ONE TABLET BY MOUTH DAILY. 04/18/21   Estill Dooms, NP  fluticasone (FLONASE) 50 MCG/ACT nasal spray Place 1 spray into both  nostrils 2 (two) times daily. 07/05/21   Volney American, PA-C  levocetirizine (XYZAL) 5 MG tablet Take 1 tablet (5 mg total) by mouth daily. 07/05/21   Volney American, PA-C  Multiple Vitamin (MULTIVITAMIN) tablet Take 1 tablet by mouth daily.    [provider]  predniSONE (DELTASONE) 50 MG tablet Take 1 tablet (50 mg total) by mouth daily with breakfast. 02/26/21   Jaynee Eagles, PA-C  promethazine-dextromethorphan (PROMETHAZINE-DM) 6.25-15 MG/5ML syrup Take 5 mLs by mouth at bedtime as needed for cough. 02/26/21   Jaynee Eagles, PA-C    Family History Family History  Problem Relation Age of Onset   Other Mother        sepsis   Heart attack Father    Hypertension Sister    Hypertension Brother    Epilepsy Daughter    ADD / ADHD Daughter    Hypertension Brother      Social History Social History   Tobacco Use   Smoking status: Never   Smokeless tobacco: Never  Vaping Use   Vaping Use: Never used  Substance Use Topics   Alcohol use: Yes    Comment: wine once a month   Drug use: No     Allergies   Augmentin [amoxicillin-pot clavulanate]   Review of Systems Review of Systems Per HPI  Physical Exam Triage Vital Signs ED Triage Vitals  Enc Vitals Group     BP 07/05/21 1139 124/82     Pulse Rate 07/05/21 1139 83     Resp 07/05/21 1139 18     Temp 07/05/21 1139 99 F (37.2 C)     Temp Source 07/05/21 1139 Oral     SpO2 07/05/21 1139 96 %     Weight 07/05/21 1140 207 lb (93.9 kg)     Height 07/05/21 1140 '5\' 8"'$  (1.727 m)     Head Circumference --      Peak Flow --      Pain Score 07/05/21 1139 7     Pain Loc --      Pain Edu? --      Excl. in [place]? --    No data found.  Updated Vital Signs BP 124/82 (BP Location: Right Arm)   Pulse 83   Temp 99 F (37.2 C) (Oral)   Resp 18   Ht '5\' 8"'$  (1.727 m)   Wt 207 lb (93.9 kg)   SpO2 96%   BMI 31.47 kg/m   Visual Acuity Right Eye Distance:   Left Eye Distance:   Bilateral Distance:    Right Eye Near:   Left Eye Near:    Bilateral Near:     Physical Exam Vitals and nursing note reviewed.  Constitutional:      Appearance: Normal appearance. She is not ill-appearing.  HENT:     Head: Atraumatic.     Right Ear: Tympanic membrane normal.     Left Ear: Tympanic membrane normal.     Nose: Rhinorrhea present.     Mouth/Throat:     Mouth: Mucous membranes are moist.     Pharynx: Posterior oropharyngeal erythema present. No oropharyngeal exudate.  Eyes:     Extraocular Movements: Extraocular movements intact.     Pupils: Pupils are equal, round, and reactive to light.     Comments: Mild right medial conjunctival injection, stye present to the medial aspect of upper lash line  Cardiovascular:     Rate and Rhythm: Normal rate and regular rhythm.     Heart  sounds: Normal  heart sounds.  Pulmonary:     Effort: Pulmonary effort is normal.     Breath sounds: Normal breath sounds.  Musculoskeletal:        General: Normal range of motion.     Cervical back: Normal range of motion and neck supple.  Skin:    General: Skin is warm and dry.  Neurological:     Mental Status: She is alert and oriented to person, place, and time.  Psychiatric:        Mood and Affect: Mood normal.        Thought Content: Thought content normal.        Judgment: Judgment normal.      UC Treatments / Results  Labs (all labs ordered are listed, but only abnormal results are displayed) Labs Reviewed - No data to display  EKG   Radiology No results found.  Procedures Procedures (including critical care time)  Medications Ordered in UC Medications - No data to display  Initial Impression / Assessment and Plan / UC Course  I have reviewed the triage vital signs and the nursing notes.  Pertinent labs & imaging results that were available during my care of the patient were reviewed by me and considered in my medical decision making (see chart for details).     Suspect uncontrolled seasonal allergies causing allergic sinusitis.  Start good allergy regimen xyzal and Flonase daily, short course of prednisone to help reduce current symptom flare and erythromycin, warm compresses for stye and eye irritation  Final Clinical Impressions(s) / UC Diagnoses   Final diagnoses:  Seasonal allergic rhinitis due to other allergic trigger  Allergic sinusitis  Hordeolum externum of right upper eyelid   Discharge Instructions   None    ED Prescriptions     Medication Sig Dispense Auth. Provider   erythromycin ophthalmic ointment Place a 1/2 inch ribbon of ointment into the right lower eyelid BID. 3.5 g Volney American, PA-C   fluticasone [facility]) 50 MCG/ACT nasal spray Place 1 spray into both nostrils 2 (two) times daily. 16 g Volney American, [place]   levocetirizine  (XYZAL) 5 MG tablet Take 1 tablet (5 mg total) by mouth daily. 30 tablet Volney American, [place]   predniSONE (DELTASONE) 20 MG tablet Take 2 tablets (40 mg total) by mouth daily with breakfast. 6 tablet Volney American, [place]      PDMP not reviewed this encounter.   Volney American, [place] 07/05/21 1234

## 2021-07-05 NOTE — ED Triage Notes (Signed)
Pt reports bilateral eye drainage, cough, blood noted in tissue when blows nose x1 week. Pt reports has history of allergies and reports was working outside prior to symptoms starting. Pt denies any known fevers and reports "I'm just tired of dealing with it."   Pt reports has tried otc medication and drops with no change in symptoms.

## 2021-10-03 ENCOUNTER — Other Ambulatory Visit: Payer: Self-pay | Admitting: Family Medicine

## 2021-10-04 NOTE — Telephone Encounter (Signed)
Provider not at this practice, will refuse medicarion. Not assigned to protocol.  Requested Prescriptions  Pending Prescriptions Disp Refills  . levocetirizine (XYZAL) 5 MG tablet [Pharmacy Med Name: LEVOCETIRIZINE 5 MG TABLET] 30 tablet 0    Sig: TAKE ONE TABLET BY MOUTH IN THE EVENING.     There is no refill protocol information for this order

## 2022-01-16 ENCOUNTER — Other Ambulatory Visit: Payer: Self-pay | Admitting: Adult Health

## 2022-05-27 ENCOUNTER — Other Ambulatory Visit: Payer: Self-pay | Admitting: Adult Health

## 2022-07-11 ENCOUNTER — Encounter: Payer: Self-pay | Admitting: Adult Health

## 2022-07-11 ENCOUNTER — Ambulatory Visit: Payer: No Typology Code available for payment source | Admitting: Adult Health

## 2022-07-11 ENCOUNTER — Other Ambulatory Visit (HOSPITAL_COMMUNITY)
Admission: RE | Admit: 2022-07-11 | Discharge: 2022-07-11 | Disposition: A | Discharge status: Home / Self Care | Payer: No Typology Code available for payment source | Source: Ambulatory Visit | Attending: Adult Health | Admitting: Adult Health

## 2022-07-11 VITALS — BP 125/74 | HR 75 | Ht 68.0 in | Wt 222.0 lb

## 2022-07-11 DIAGNOSIS — Z08 Encounter for follow-up examination after completed treatment for malignant neoplasm: Secondary | ICD-10-CM

## 2022-07-11 DIAGNOSIS — Z01419 Encounter for gynecological examination (general) (routine) without abnormal findings: Secondary | ICD-10-CM

## 2022-07-11 DIAGNOSIS — Z90710 Acquired absence of both cervix and uterus: Secondary | ICD-10-CM

## 2022-07-11 DIAGNOSIS — U071 COVID-19: Secondary | ICD-10-CM | POA: Insufficient documentation

## 2022-07-11 DIAGNOSIS — Z1329 Encounter for screening for other suspected endocrine disorder: Secondary | ICD-10-CM

## 2022-07-11 DIAGNOSIS — Z1211 Encounter for screening for malignant neoplasm of colon: Secondary | ICD-10-CM

## 2022-07-11 DIAGNOSIS — N898 Other specified noninflammatory disorders of vagina: Secondary | ICD-10-CM | POA: Insufficient documentation

## 2022-07-11 DIAGNOSIS — F419 Anxiety disorder, unspecified: Secondary | ICD-10-CM | POA: Diagnosis not present

## 2022-07-11 DIAGNOSIS — R059 Cough, unspecified: Secondary | ICD-10-CM | POA: Insufficient documentation

## 2022-07-11 DIAGNOSIS — L900 Lichen sclerosus et atrophicus: Secondary | ICD-10-CM | POA: Diagnosis not present

## 2022-07-11 DIAGNOSIS — Z13220 Encounter for screening for lipoid disorders: Secondary | ICD-10-CM

## 2022-07-11 DIAGNOSIS — Z1231 Encounter for screening mammogram for malignant neoplasm of breast: Secondary | ICD-10-CM | POA: Insufficient documentation

## 2022-07-11 DIAGNOSIS — R0602 Shortness of breath: Secondary | ICD-10-CM

## 2022-07-11 DIAGNOSIS — Z1212 Encounter for screening for malignant neoplasm of rectum: Secondary | ICD-10-CM

## 2022-07-11 LAB — HEMOCCULT GUIAC POC 1CARD (OFFICE): Fecal Occult Blood, POC: NEGATIVE

## 2022-07-11 MED ORDER — ALBUTEROL SULFATE HFA 108 (90 BASE) MCG/ACT IN AERS: 2.0000 | INHALATION_SPRAY | RESPIRATORY_TRACT | 0 refills | Status: AC | PRN

## 2022-07-11 MED ORDER — CLOBETASOL PROPIONATE 0.05 % EX OINT: TOPICAL_OINTMENT | CUTANEOUS | 3 refills | Status: AC

## 2022-07-11 MED ORDER — ESCITALOPRAM OXALATE 20 MG PO TABS: 20.0000 mg | ORAL_TABLET | Freq: Every day | ORAL | 12 refills | Status: DC

## 2022-07-11 NOTE — Progress Notes (Signed)
Patient ID: Rhonda [name], female   DOB: 04-20-1960, 62 y.o.   MRN: [id number] History of Present Illness: Rhonda [name] is a 62 year old white female, divorced, sp hysterectomy, in for well woman gyn exam and pap. She is having some dryness and itching in vaginal area. She has recently started weight loss injections.   Current Medications, Allergies, Past Medical History, Past Surgical History, Family History and Social History were reviewed in [employer] record.     Review of Systems:  Patient denies any hearing loss, fatigue, blurred vision, chest pain, abdominal pain, problems with bowel movements, urination, or intercourse(not active). No joint pain or mood swings.  Has headache at times on right side of head, has to rest. Has [facility] with allergies requests inhaler refill  See HPI for positives   Physical Exam:BP 125/74 (BP Location: Right Arm, Patient Position: Sitting, Cuff Size: Large)   Pulse 75   Ht 5\' 8"  (1.727 m)   Wt 222 lb (100.7 kg)   BMI 33.75 kg/m   General:  Well developed, well nourished, no acute distress Skin:  Warm and dry Neck:  Midline trachea, normal thyroid, good ROM, no lymphadenopathy, no carotid bruits heard Lungs; Clear to auscultation bilaterally Breast:  No dominant palpable mass, retraction, or nipple discharge Cardiovascular: Regular rate and rhythm Abdomen:  Soft, non tender, no hepatosplenomegaly Pelvic:  External genitalia is normal in appearance, but has thin dry skin above clit area.  The vagina is pale. Urethra has no lesions or masses. The cervix and uterus are absent, pap performed on vagina with HR HPV genotyping, per Dr Rhonda [name].  No adnexal masses or tenderness noted.Bladder is non tender, no masses felt. Rectal: Good sphincter tone, no polyps, or hemorrhoids felt.  Hemoccult negative. Extremities/musculoskeletal:  No swelling or varicosities noted, no clubbing or cyanosis Psych:  No mood changes, alert and cooperative,seems  happy AA is 1 Fall risk is moderate    07/11/2022   11:18 AM 05/03/2019    9:28 AM 08/23/2016    9:16 AM  Depression screen PHQ 2/9  Decreased Interest 0 0 0  Down, Depressed, Hopeless 0 0 0  PHQ - 2 Score 0 0 0  Altered sleeping 0 0   Tired, decreased energy 3 3   Change in appetite 1 0   Feeling bad or failure about yourself  0 0   Trouble concentrating 0 0   Moving slowly or fidgety/restless 0 0   Suicidal thoughts 0 0   PHQ-9 Score 4 3   Difficult doing work/chores  Not difficult at all       07/11/2022   11:18 AM 05/03/2019    9:30 AM  GAD 7 : Generalized Anxiety Score  Nervous, Anxious, on Edge 0 0  Control/stop worrying 1 0  Worry too much - different things 1 0  Trouble relaxing 1 0  Restless 0 0  Easily annoyed or irritable 0 0  Afraid - awful might happen 0 0  Total GAD 7 Score 3 0  Anxiety Difficulty  Not difficult at all      Upstream - 07/11/22 1128       Pregnancy Intention Screening   Does the patient want to become pregnant in the next year? N/A    Does the patient's partner want to become pregnant in the next year? N/A    Would the patient like to discuss contraceptive options today? N/A      Contraception Wrap Up   Current Method  Female Sterilization   hyst   End Method Female Sterilization   hyst   Contraception Counseling Provided No            Examination chaperoned by Dr Rhonda [name]  Impression and Plan: 1. Vaginal Pap smear following hysterectomy for malignancy Pap sent  Pap in 3 years if normal  - Cytology - PAP( [place])  2. Encounter for well woman exam with routine gynecological exam Physical in 1 year  Will check labs she is fasting  - CBC - Comprehensive metabolic panel - TSH - Lipid panel  3. Encounter for screening fecal occult blood testing Hemoccult was negative  - POCT occult blood stool  4. Anxiety Good with lexapro, will refill  Meds ordered this encounter  Medications   clobetasol ointment (TEMOVATE) 0.05 %     Sig: Use bid for 2 weeks then 2-3 x wekly    Dispense:  30 g    Refill:  3    Order Specific Question:   Supervising Provider    Answer:   Despina Hidden, LUTHER H [[id number]]   escitalopram (LEXAPRO) 20 MG tablet    Sig: Take 1 tablet (20 mg total) by mouth daily.    Dispense:  30 tablet    Refill:  12    Order Specific Question:   Supervising Provider    Answer:   Duane Lope H [[id number]]   albuterol (VENTOLIN HFA) 108 (90 Base) MCG/ACT inhaler    Sig: Inhale 2 puffs into the lungs every 4 (four) hours as needed for wheezing or shortness of breath (cough, shortness of breath or wheezing.).    Dispense:  6.7 g    Refill:  0    Order Specific Question:   Supervising Provider    Answer:   EURE, LUTHER H [[id number]]     5. Lichen sclerosus et atrophicus Will rx temovate and recheck in 4 months she is aware it is chronic   6. Itching in the vaginal area Has LSA  7. Screening cholesterol level  - Lipid panel  8. Screening for thyroid disorder  - TSH  9. Shortness of breath  - albuterol (VENTOLIN HFA) 108 (90 Base) MCG/ACT inhaler; Inhale 2 puffs into the lungs every 4 (four) hours as needed for wheezing or shortness of breath (cough, shortness of breath or wheezing.).  Dispense: 6.7 g; Refill: 0  10. Screening mammogram for breast cancer Pt will call for mammogram appt - MM 3D SCREENING MAMMOGRAM BILATERAL BREAST; Future  13. Screening for colorectal cancer - Cologuard

## 2022-07-12 LAB — COMPREHENSIVE METABOLIC PANEL
ALT: 22 IU/L (ref 0–32)
AST: 20 IU/L (ref 0–40)
Albumin/Globulin Ratio: 1.7
Albumin: 4.5 g/dL (ref 3.9–4.9)
Alkaline Phosphatase: 107 IU/L (ref 44–121)
BUN/Creatinine Ratio: 14 (ref 12–28)
BUN: 11 mg/dL (ref 8–27)
Bilirubin Total: 0.4 mg/dL (ref 0.0–1.2)
CO2: 23 mmol/L (ref 20–29)
Calcium: 9.7 mg/dL (ref 8.7–10.3)
Chloride: 102 mmol/L (ref 96–106)
Creatinine, Ser: 0.77 mg/dL (ref 0.57–1.00)
Globulin, Total: 2.6 g/dL (ref 1.5–4.5)
Glucose: 92 mg/dL (ref 70–99)
Potassium: 5 mmol/L (ref 3.5–5.2)
Sodium: 140 mmol/L (ref 134–144)
Total Protein: 7.1 g/dL (ref 6.0–8.5)
eGFR: 87 mL/min/{1.73_m2} (ref >59)

## 2022-07-12 LAB — CBC
Hematocrit: 41.8 % (ref 34.0–46.6)
Hemoglobin: 14 g/dL (ref 11.1–15.9)
MCH: 28.3 pg (ref 26.6–33.0)
MCHC: 33.5 g/dL (ref 31.5–35.7)
MCV: 85 fL (ref 79–97)
Platelets: 307 10*3/uL (ref 150–450)
RBC: 4.94 x10E6/uL (ref 3.77–5.28)
RDW: 13.3 % (ref 11.7–15.4)
WBC: 5.5 10*3/uL (ref 3.4–10.8)

## 2022-07-12 LAB — LIPID PANEL
Chol/HDL Ratio: 3.8 ratio (ref 0.0–4.4)
Cholesterol, Total: 232 mg/dL — ABNORMAL HIGH (ref 100–199)
HDL: 61 mg/dL (ref >39)
LDL Chol Calc (NIH): 151 mg/dL — ABNORMAL HIGH (ref 0–99)
Triglycerides: 113 mg/dL (ref 0–149)
VLDL Cholesterol Cal: 20 mg/dL (ref 5–40)

## 2022-07-12 LAB — TSH: TSH: 2.97 u[IU]/mL (ref 0.450–4.500)

## 2022-07-16 LAB — CYTOLOGY - PAP
Comment: NEGATIVE
Comment: NEGATIVE
Comment: NEGATIVE
HPV 16: NEGATIVE
HPV 18 / 45: NEGATIVE
High risk HPV: POSITIVE — AB

## 2022-07-18 ENCOUNTER — Encounter: Payer: Self-pay | Admitting: Adult Health

## 2022-07-18 DIAGNOSIS — R87612 Low grade squamous intraepithelial lesion on cytologic smear of cervix (LGSIL): Secondary | ICD-10-CM | POA: Insufficient documentation

## 2022-08-02 ENCOUNTER — Ambulatory Visit: Payer: No Typology Code available for payment source

## 2022-08-02 ENCOUNTER — Ambulatory Visit
Admission: EM | Admit: 2022-08-02 | Discharge: 2022-08-02 | Disposition: A | Discharge status: Home / Self Care | Payer: No Typology Code available for payment source | Attending: Nurse Practitioner | Admitting: Nurse Practitioner

## 2022-08-02 ENCOUNTER — Ambulatory Visit (INDEPENDENT_AMBULATORY_CARE_PROVIDER_SITE_OTHER): Payer: No Typology Code available for payment source

## 2022-08-02 DIAGNOSIS — S20212A Contusion of left front wall of thorax, initial encounter: Secondary | ICD-10-CM

## 2022-08-02 DIAGNOSIS — S5002XA Contusion of left elbow, initial encounter: Secondary | ICD-10-CM

## 2022-08-02 NOTE — ED Triage Notes (Signed)
Pt c/o fall causing abrasions and side pain. Pt states she was walking out of her office when she slide on a rock around 3:00 pm.   Pt states she has had an elbow surgery on her left elbow. Left flank pain Hurts to breathe, elbow hurts.

## 2022-08-02 NOTE — Discharge Instructions (Addendum)
The x-rays of the left elbow and left rib cage are negative for fracture.  Symptoms appear to be consistent with a rib contusion and a left elbow contusion. May take over-the-counter Tylenol or ibuprofen as needed for pain or discomfort. Apply ice to the left elbow to help with pain and swelling.  You may apply ice or heat to the left rib cage.  Apply for 20 minutes, remove for 1 hour, then repeat as needed. Make sure you are coughing and deep breathing normally to prevent the development of pneumonia. Please be advised that symptoms can persist for the next 2 to 3 weeks.  If you experience worsening pain in the left chest, difficulty breathing, or other concerns, please go to the emergency department immediately for further evaluation. Follow-up as needed.

## 2022-08-02 NOTE — ED Provider Notes (Signed)
[facility]    CSN: [id number] Arrival date & time: 08/02/22  1736      History   Chief Complaint No chief complaint on file.   HPI Rhonda [name] is a 62 y.o. female.   The history is provided by the patient.   The patient presents for complaints of left rib cage pain and left elbow pain after she fell today at work.  Patient states that she landed on her abdomen, but also hit her elbow and left side as she was falling.  She complains of pain with deep breathing, coughing, and moving.  She denies shortness of breath or difficulty breathing.  She states that she did have surgery to the left elbow approximately 6 years ago and now has an implant in the left elbow.  She states at baseline, she has decreased range of motion to the elbow.    Past Medical History:  Diagnosis Date   Allergy    seasonal   Anxiety    Cancer (HCC)    cervical cancer   Closed olecranon fracture    left   Headache    Pneumonia    PONV (postoperative nausea and vomiting)    Radial neck fracture    Vitamin D deficiency 08/28/2016    Patient Active Problem List   Diagnosis Date Noted   LGSIL on Pap smear of cervix 07/18/2022   Itching in the vaginal area 07/11/2022   Lichen sclerosus et atrophicus 07/11/2022   Encounter for screening fecal occult blood testing 07/11/2022   Screening for thyroid disorder 07/11/2022   Screening cholesterol level 07/11/2022   Screening mammogram for breast cancer 07/11/2022   Lab test positive for detection of COVID-19 virus 07/11/2022   Cough 07/11/2022   Shortness of breath 07/11/2022   Screening for colorectal cancer 05/03/2019   Seasonal allergies 05/03/2019   Herpes 05/03/2019   Vitamin D deficiency 08/28/2016   Constipation 08/23/2016   Weight gain 08/23/2016   Encounter for well woman exam with routine gynecological exam 08/23/2016   Vaginal Pap smear following hysterectomy for malignancy 08/23/2016   Elbow fracture, left 11/18/2014   Anxiety  04/28/2014    Past Surgical History:  Procedure Laterality Date   ABDOMINAL HYSTERECTOMY     partial   CHOLECYSTECTOMY     ORIF ELBOW FRACTURE Left 11/18/2014   Procedure: OPEN REDUCTION INTERNAL FIXATION (ORIF) LEFT OLECRANON, OPEN REDUCTION INTERNAL FIXATION LEFT RADIAL NECK FRACTURE;  Surgeon: Eldred Manges, MD;  Location: [facility] OR;  Service: Orthopedics;  Laterality: Left;    OB History     Gravida  1   Para  1   Term  1   Preterm      AB      Living  1      SAB      IAB      Ectopic      Multiple      Live Births  1            Home Medications    Prior to Admission medications   Medication Sig Start Date End Date Taking? Authorizing Provider  albuterol (VENTOLIN HFA) 108 (90 Base) MCG/ACT inhaler Inhale 2 puffs into the lungs every 4 (four) hours as needed for wheezing or shortness of breath (cough, shortness of breath or wheezing.). 07/11/22   Adline Potter, NP  clobetasol ointment (TEMOVATE) 0.05 % Use bid for 2 weeks then 2-3 x wekly 07/11/22   Adline Potter, NP  escitalopram (LEXAPRO) 20 MG tablet Take 1 tablet (20 mg total) by mouth daily. 07/11/22   Adline Potter, NP  fluticasone (FLONASE) 50 MCG/ACT nasal spray Place 1 spray into both nostrils 2 (two) times daily. 07/05/21   Particia Nearing, PA-C  levocetirizine (XYZAL) 5 MG tablet Take 1 tablet (5 mg total) by mouth daily. 07/05/21   Particia Nearing, PA-C  Multiple Vitamin (MULTIVITAMIN) tablet Take 1 tablet by mouth daily.    [provider]  Semaglutide-Weight Management 0.25 MG/0.5ML SOAJ Inject 0.25 mg into the skin.    [provider]    Family History Family History  Problem Relation Age of Onset   Other Mother        sepsis   Heart attack Father    Hypertension Sister    Hypertension Brother    Epilepsy Daughter    ADD / ADHD Daughter    Hypertension Brother     Social History Social History   Tobacco Use   Smoking status: Never    Smokeless tobacco: Never  Vaping Use   Vaping Use: Never used  Substance Use Topics   Alcohol use: Yes    Comment: wine once a month   Drug use: No     Allergies   Patient has no known allergies.   Review of Systems Review of Systems Per HPI  Physical Exam Triage Vital Signs ED Triage Vitals  Enc Vitals Group     BP 08/02/22 1859 133/82     Pulse Rate 08/02/22 1859 87     Resp 08/02/22 1859 13     Temp 08/02/22 1859 97.6 F (36.4 C)     Temp Source 08/02/22 1859 Oral     SpO2 08/02/22 1859 95 %     Weight --      Height --      Head Circumference --      Peak Flow --      Pain Score 08/02/22 1903 8     Pain Loc --      Pain Edu? --      Excl. in GC? --    No data found.  Updated Vital Signs BP 133/82 (BP Location: Right Arm)   Pulse 87   Temp 97.6 F (36.4 C) (Oral)   Resp 13   SpO2 95%   Visual Acuity Right Eye Distance:   Left Eye Distance:   Bilateral Distance:    Right Eye Near:   Left Eye Near:    Bilateral Near:     Physical Exam Vitals and nursing note reviewed.  Constitutional:      General: She is not in acute distress.    Appearance: Normal appearance.  HENT:     Head: Normocephalic.  Eyes:     Extraocular Movements: Extraocular movements intact.     Pupils: Pupils are equal, round, and reactive to light.  Cardiovascular:     Rate and Rhythm: Normal rate and regular rhythm.  Pulmonary:     Effort: Pulmonary effort is normal. No respiratory distress.     Breath sounds: Normal breath sounds. No stridor. No wheezing, rhonchi or rales.  Chest:     Chest wall: Tenderness (Left rib cage pain noted between ribs 6 through 8.  There is no bruising, ecchymosis, or swelling present.) present. No mass, lacerations, deformity or swelling.  Abdominal:     General: Bowel sounds are normal.     Palpations: Abdomen is soft.     Tenderness: There  is no abdominal tenderness.  Musculoskeletal:     Cervical back: Normal range of motion.      Thoracic back: Tenderness (Tenderness noted to the left mid back between T9-T11.) present. No swelling or deformity.  Skin:    General: Skin is warm and dry.  Neurological:     General: No focal deficit present.     Mental Status: She is alert and oriented to person, place, and time.  Psychiatric:        Mood and Affect: Mood normal.        Behavior: Behavior normal.      UC Treatments / Results  Labs (all labs ordered are listed, but only abnormal results are displayed) Labs Reviewed - No data to display  EKG   Radiology DG Elbow Complete Left  Result Date: 08/02/2022 CLINICAL DATA:  left elbow pain and left ribcage pain s/p fall EXAM: LEFT ELBOW - COMPLETE 3+ VIEW COMPARISON:  X-ray left elbow 11/13/2014 FINDINGS: Interval plate and screw fixation of a chronic radial head/neck and olecranon fracture. There is no evidence of acute displaced fracture, dislocation, or joint effusion. There is no evidence of arthropathy or other focal bone abnormality. Soft tissues are unremarkable. IMPRESSION: No acute displaced fracture or dislocation in a patient with plate and screw fixation of a chronic radial head/neck and olecranon fracture. Electronically Signed   By: Tish Frederickson M.D.   On: 08/02/2022 20:07   DG Ribs Unilateral W/Chest Left  Result Date: 08/02/2022 CLINICAL DATA:  Pain EXAM: LEFT RIBS AND CHEST - 3+ VIEW COMPARISON:  Chest x-ray 11/18/2014 FINDINGS: No fracture or other bone lesions are seen involving the ribs. There is no evidence of pneumothorax or pleural effusion. Both lungs are clear. Heart size and mediastinal contours are within normal limits. IMPRESSION: Negative. Electronically Signed   By: Darliss Cheney M.D.   On: 08/02/2022 19:54    Procedures Procedures (including critical care time)  Medications Ordered in UC Medications - No data to display  Initial Impression / Assessment and Plan / UC Course  I have reviewed the triage vital signs and the nursing  notes.  Pertinent labs & imaging results that were available during my care of the patient were reviewed by me and considered in my medical decision making (see chart for details).  The patient is well-appearing, she is in no acute distress, vital signs are stable.  X-ray of the left elbow and left rib cage are negative for fracture or dislocation.  Symptoms appear to be consistent with a contusion of the left elbow and of the left rib cage based on the mechanism of injury.  Supportive care recommendations were provided and discussed with the patient to include over-the-counter analgesics for pain or discomfort, using ice to the elbow and left rib cage to help with pain and swelling, and coughing and deep breathing to help prevent the development of pneumonia.  Patient was given strict ER follow-up precautions.  Patient is in agreement with this plan of care and verbalizes understanding.  All questions were answered.  Patient stable for discharge.   Final Clinical Impressions(s) / UC Diagnoses   Final diagnoses:  Rib contusion, left, initial encounter  Contusion of left elbow, initial encounter     Discharge Instructions      The x-rays of the left elbow and left rib cage are negative for fracture.  Symptoms appear to be consistent with a rib contusion and a left elbow contusion. May take over-the-counter Tylenol or ibuprofen  as needed for pain or discomfort. Apply ice to the left elbow to help with pain and swelling.  You may apply ice or heat to the left rib cage.  Apply for 20 minutes, remove for 1 hour, then repeat as needed. Make sure you are coughing and deep breathing normally to prevent the development of pneumonia. Please be advised that symptoms can persist for the next 2 to 3 weeks.  If you experience worsening pain in the left chest, difficulty breathing, or other concerns, please go to the emergency department immediately for further evaluation. Follow-up as needed.     ED  Prescriptions   None    PDMP not reviewed this encounter.   Abran Cantor, NP 08/03/22 [phone]

## 2022-08-06 ENCOUNTER — Other Ambulatory Visit (HOSPITAL_COMMUNITY)
Admission: RE | Admit: 2022-08-06 | Discharge: 2022-08-06 | Disposition: A | Discharge status: Home / Self Care | Payer: No Typology Code available for payment source | Source: Ambulatory Visit | Attending: Obstetrics & Gynecology | Admitting: Obstetrics & Gynecology

## 2022-08-06 ENCOUNTER — Ambulatory Visit: Payer: No Typology Code available for payment source | Admitting: Obstetrics & Gynecology

## 2022-08-06 ENCOUNTER — Encounter: Payer: Self-pay | Admitting: Obstetrics & Gynecology

## 2022-08-06 VITALS — BP 114/77 | HR 76 | Ht 68.0 in | Wt 220.0 lb

## 2022-08-06 DIAGNOSIS — N842 Polyp of vagina: Secondary | ICD-10-CM | POA: Insufficient documentation

## 2022-08-06 DIAGNOSIS — N890 Mild vaginal dysplasia: Secondary | ICD-10-CM | POA: Diagnosis present

## 2022-08-06 LAB — COLOGUARD: COLOGUARD: NEGATIVE

## 2022-08-06 NOTE — Addendum Note (Signed)
Addended by: Moss Mc on: 08/06/2022 10:24 AM   Modules accepted: Orders

## 2022-08-06 NOTE — Progress Notes (Signed)
    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 New sexual partner:  No.    History of abnormal Pap: yes  Procedure Details  The risks and benefits of the procedure and Written informed consent obtained.  Speculum placed in vagina and excellent visualization of cervix achieved, cervix swabbed x 3 with acetic acid solution.  Findings: Adequate colposcopy is noted today.  Cervix: absent Vaginal inspection: small polyp removed otherwise no dysplasia. Vulvar colposcopy: not done.  Specimens: vaginal polyp  Complications: none.  Colposcopic Impression: Vaginal polyp  Plan(Based on 2019 ASCCP recommendations) MyChart video follow up

## 2022-08-07 LAB — SURGICAL PATHOLOGY

## 2022-08-14 ENCOUNTER — Telehealth: Payer: No Typology Code available for payment source | Admitting: Obstetrics & Gynecology

## 2022-08-14 DIAGNOSIS — N842 Polyp of vagina: Secondary | ICD-10-CM

## 2022-08-14 NOTE — Progress Notes (Signed)
MyChart connect visit Pt is in her office I am in my office Total time: 10 minutes  Follow up appointment for results: Colpo  Chief Complaint  Patient presents with   Follow-up    Results from colpo    There were no vitals taken for this visit.  CervicL BIOPSY: FIBROEPITHELEAL POLYP benign, no follow up needed no dysplasia  MEDS ordered this encounter: No orders of the defined types were placed in this encounter.   Orders for this encounter: No orders of the defined types were placed in this encounter.   Impression + Management Plan   ICD-10-CM   1. Vaginal polyp  N84.2       Follow Up: Return in about 1 year (around 08/14/2023) for HPV based cytology.     All questions were answered.  Past Medical History:  Diagnosis Date   Allergy    seasonal   Anxiety    Cancer (HCC)    cervical cancer   Closed olecranon fracture    left   Headache    Pneumonia    PONV (postoperative nausea and vomiting)    Radial neck fracture    Vitamin D deficiency 08/28/2016    Past Surgical History:  Procedure Laterality Date   ABDOMINAL HYSTERECTOMY     partial   CHOLECYSTECTOMY     ORIF ELBOW FRACTURE Left 11/18/2014   Procedure: OPEN REDUCTION INTERNAL FIXATION (ORIF) LEFT OLECRANON, OPEN REDUCTION INTERNAL FIXATION LEFT RADIAL NECK FRACTURE;  Surgeon: Eldred Manges, MD;  Location: [facility] OR;  Service: Orthopedics;  Laterality: Left;    OB History     Gravida  1   Para  1   Term  1   Preterm      AB      Living  1      SAB      IAB      Ectopic      Multiple      Live Births  1           No Known Allergies  Social History   Socioeconomic History   Marital status: Divorced    Spouse name: Not on file   Number of children: Not on file   Years of education: Not on file   Highest education level: Not on file  Occupational History   Not on file  Tobacco Use   Smoking status: Never   Smokeless tobacco: Never  Vaping Use   Vaping status: Never  Used  Substance and Sexual Activity   Alcohol use: Yes    Comment: wine once a month   Drug use: No   Sexual activity: Not Currently    Birth control/protection: Surgical    Comment: hyst  Other Topics Concern   Not on file  Social History Narrative   Not on file   Social Determinants of Health   Financial Resource Strain: Low Risk  (07/11/2022)   Overall Financial Resource Strain (CARDIA)    Difficulty of Paying Living Expenses: Not hard at all  Food Insecurity: No Food Insecurity (07/11/2022)   Hunger Vital Sign    Worried About Running Out of Food in the Last Year: Never true    Ran Out of Food in the Last Year: Never true  Transportation Needs: No Transportation Needs (07/11/2022)   PRAPARE - Administrator, Civil Service (Medical): No    Lack of Transportation (Non-Medical): No  Physical Activity: Insufficiently Active (07/11/2022)   Exercise Vital Sign  Days of Exercise per Week: 2 days    Minutes of Exercise per Session: 30 min  Stress: Stress Concern Present (07/11/2022)   [employer] of Occupational Health - Occupational Stress Questionnaire    Feeling of Stress : To some extent  Social Connections: Moderately Integrated (07/11/2022)   Social Connection and Isolation Panel [NHANES]    Frequency of Communication with Friends and Family: More than three times a week    Frequency of Social Gatherings with Friends and Family: More than three times a week    Attends Religious Services: 1 to 4 times per year    Active Member of [employer] or Organizations: Yes    Attends Engineer, structural: More than 4 times per year    Marital Status: Divorced    Family History  Problem Relation Age of Onset   Other Mother        sepsis   Heart attack Father    Hypertension Sister    Hypertension Brother    Epilepsy Daughter    ADD / ADHD Daughter    Hypertension Brother

## 2022-08-15 ENCOUNTER — Telehealth: Payer: No Typology Code available for payment source | Admitting: Obstetrics & Gynecology

## 2022-11-11 ENCOUNTER — Ambulatory Visit: Payer: No Typology Code available for payment source | Admitting: Adult Health

## 2023-07-15 ENCOUNTER — Other Ambulatory Visit: Payer: Self-pay | Admitting: Adult Health
